# Patient Record
Sex: Male | Born: 1950 | Race: White | Hispanic: No | Marital: Married | Smoking: Never smoker
Health system: Southern US, Community
[De-identification: ages and names within clinical notes are randomized; demographics above are authoritative.]

## PROBLEM LIST (undated history)

## (undated) DIAGNOSIS — R0609 Other forms of dyspnea: Secondary | ICD-10-CM

## (undated) DIAGNOSIS — M810 Age-related osteoporosis without current pathological fracture: Secondary | ICD-10-CM

## (undated) DIAGNOSIS — L0390 Cellulitis, unspecified: Secondary | ICD-10-CM

## (undated) DIAGNOSIS — K4090 Unilateral inguinal hernia, without obstruction or gangrene, not specified as recurrent: Secondary | ICD-10-CM

## (undated) DIAGNOSIS — L089 Local infection of the skin and subcutaneous tissue, unspecified: Secondary | ICD-10-CM

## (undated) DIAGNOSIS — C439 Malignant melanoma of skin, unspecified: Secondary | ICD-10-CM

## (undated) DIAGNOSIS — E785 Hyperlipidemia, unspecified: Secondary | ICD-10-CM

## (undated) DIAGNOSIS — I10 Essential (primary) hypertension: Secondary | ICD-10-CM

## (undated) DIAGNOSIS — C801 Malignant (primary) neoplasm, unspecified: Secondary | ICD-10-CM

## (undated) DIAGNOSIS — R002 Palpitations: Secondary | ICD-10-CM

## (undated) HISTORY — DX: Cellulitis, unspecified: L03.90

## (undated) HISTORY — PX: OTHER SURGICAL HISTORY: SHX169

## (undated) HISTORY — DX: Palpitations: R00.2

## (undated) HISTORY — DX: Local infection of the skin and subcutaneous tissue, unspecified: L08.9

## (undated) HISTORY — DX: Age-related osteoporosis without current pathological fracture: M81.0

## (undated) HISTORY — DX: Hyperlipidemia, unspecified: E78.5

## (undated) HISTORY — PX: MELANOMA EXCISION: SHX5266

## (undated) HISTORY — DX: Other forms of dyspnea: R06.09

## (undated) HISTORY — PX: TONSILLECTOMY: SUR1361

## (undated) HISTORY — DX: Unilateral inguinal hernia, without obstruction or gangrene, not specified as recurrent: K40.90

## (undated) HISTORY — DX: Malignant melanoma of skin, unspecified: C43.9

---

## 2012-01-27 HISTORY — PX: INGUINAL HERNIA REPAIR: SHX194

## 2016-06-04 DIAGNOSIS — E559 Vitamin D deficiency, unspecified: Secondary | ICD-10-CM | POA: Diagnosis not present

## 2016-06-04 DIAGNOSIS — E782 Mixed hyperlipidemia: Secondary | ICD-10-CM | POA: Diagnosis not present

## 2016-06-10 DIAGNOSIS — R030 Elevated blood-pressure reading, without diagnosis of hypertension: Secondary | ICD-10-CM | POA: Diagnosis not present

## 2016-06-10 DIAGNOSIS — E782 Mixed hyperlipidemia: Secondary | ICD-10-CM | POA: Diagnosis not present

## 2016-06-10 DIAGNOSIS — E210 Primary hyperparathyroidism: Secondary | ICD-10-CM | POA: Diagnosis not present

## 2016-09-08 DIAGNOSIS — Z87442 Personal history of urinary calculi: Secondary | ICD-10-CM | POA: Diagnosis not present

## 2016-09-09 DIAGNOSIS — Z87442 Personal history of urinary calculi: Secondary | ICD-10-CM | POA: Diagnosis not present

## 2016-09-10 DIAGNOSIS — E210 Primary hyperparathyroidism: Secondary | ICD-10-CM | POA: Diagnosis not present

## 2016-09-10 DIAGNOSIS — Z87442 Personal history of urinary calculi: Secondary | ICD-10-CM | POA: Diagnosis not present

## 2016-12-30 DIAGNOSIS — Z23 Encounter for immunization: Secondary | ICD-10-CM | POA: Diagnosis not present

## 2016-12-30 DIAGNOSIS — E782 Mixed hyperlipidemia: Secondary | ICD-10-CM | POA: Diagnosis not present

## 2016-12-30 DIAGNOSIS — E210 Primary hyperparathyroidism: Secondary | ICD-10-CM | POA: Diagnosis not present

## 2016-12-30 DIAGNOSIS — Z0000 Encounter for general adult medical examination without abnormal findings: Secondary | ICD-10-CM | POA: Diagnosis not present

## 2016-12-30 DIAGNOSIS — Z125 Encounter for screening for malignant neoplasm of prostate: Secondary | ICD-10-CM | POA: Diagnosis not present

## 2017-01-06 DIAGNOSIS — R030 Elevated blood-pressure reading, without diagnosis of hypertension: Secondary | ICD-10-CM | POA: Diagnosis not present

## 2017-01-06 DIAGNOSIS — Z87442 Personal history of urinary calculi: Secondary | ICD-10-CM | POA: Diagnosis not present

## 2017-01-06 DIAGNOSIS — E782 Mixed hyperlipidemia: Secondary | ICD-10-CM | POA: Diagnosis not present

## 2017-01-06 DIAGNOSIS — E210 Primary hyperparathyroidism: Secondary | ICD-10-CM | POA: Diagnosis not present

## 2018-03-04 DIAGNOSIS — Z125 Encounter for screening for malignant neoplasm of prostate: Secondary | ICD-10-CM | POA: Diagnosis not present

## 2018-03-04 DIAGNOSIS — Z87442 Personal history of urinary calculi: Secondary | ICD-10-CM | POA: Diagnosis not present

## 2018-03-04 DIAGNOSIS — R030 Elevated blood-pressure reading, without diagnosis of hypertension: Secondary | ICD-10-CM | POA: Diagnosis not present

## 2018-03-04 DIAGNOSIS — E782 Mixed hyperlipidemia: Secondary | ICD-10-CM | POA: Diagnosis not present

## 2018-03-04 DIAGNOSIS — E210 Primary hyperparathyroidism: Secondary | ICD-10-CM | POA: Diagnosis not present

## 2018-03-07 DIAGNOSIS — M8580 Other specified disorders of bone density and structure, unspecified site: Secondary | ICD-10-CM | POA: Diagnosis not present

## 2018-03-07 DIAGNOSIS — M810 Age-related osteoporosis without current pathological fracture: Secondary | ICD-10-CM | POA: Diagnosis not present

## 2018-03-07 DIAGNOSIS — E782 Mixed hyperlipidemia: Secondary | ICD-10-CM | POA: Diagnosis not present

## 2018-03-07 DIAGNOSIS — Z0000 Encounter for general adult medical examination without abnormal findings: Secondary | ICD-10-CM | POA: Diagnosis not present

## 2018-03-07 DIAGNOSIS — M545 Low back pain: Secondary | ICD-10-CM | POA: Diagnosis not present

## 2018-03-09 DIAGNOSIS — M810 Age-related osteoporosis without current pathological fracture: Secondary | ICD-10-CM | POA: Diagnosis not present

## 2018-03-09 DIAGNOSIS — Z87442 Personal history of urinary calculi: Secondary | ICD-10-CM | POA: Diagnosis not present

## 2018-03-09 DIAGNOSIS — E210 Primary hyperparathyroidism: Secondary | ICD-10-CM | POA: Diagnosis not present

## 2018-03-09 DIAGNOSIS — Z6823 Body mass index (BMI) 23.0-23.9, adult: Secondary | ICD-10-CM | POA: Diagnosis not present

## 2018-03-14 DIAGNOSIS — Z1212 Encounter for screening for malignant neoplasm of rectum: Secondary | ICD-10-CM | POA: Diagnosis not present

## 2018-03-14 DIAGNOSIS — Z1211 Encounter for screening for malignant neoplasm of colon: Secondary | ICD-10-CM | POA: Diagnosis not present

## 2018-03-23 DIAGNOSIS — R195 Other fecal abnormalities: Secondary | ICD-10-CM | POA: Diagnosis not present

## 2018-04-06 DIAGNOSIS — E782 Mixed hyperlipidemia: Secondary | ICD-10-CM | POA: Diagnosis not present

## 2018-04-06 DIAGNOSIS — Z1211 Encounter for screening for malignant neoplasm of colon: Secondary | ICD-10-CM | POA: Diagnosis not present

## 2018-04-06 DIAGNOSIS — I10 Essential (primary) hypertension: Secondary | ICD-10-CM | POA: Diagnosis not present

## 2018-05-09 DIAGNOSIS — M810 Age-related osteoporosis without current pathological fracture: Secondary | ICD-10-CM | POA: Diagnosis not present

## 2018-05-09 DIAGNOSIS — E210 Primary hyperparathyroidism: Secondary | ICD-10-CM | POA: Diagnosis not present

## 2018-05-09 DIAGNOSIS — E782 Mixed hyperlipidemia: Secondary | ICD-10-CM | POA: Diagnosis not present

## 2018-05-12 DIAGNOSIS — E210 Primary hyperparathyroidism: Secondary | ICD-10-CM | POA: Diagnosis not present

## 2018-05-12 DIAGNOSIS — Z87442 Personal history of urinary calculi: Secondary | ICD-10-CM | POA: Diagnosis not present

## 2018-05-12 DIAGNOSIS — M810 Age-related osteoporosis without current pathological fracture: Secondary | ICD-10-CM | POA: Diagnosis not present

## 2018-05-17 DIAGNOSIS — E210 Primary hyperparathyroidism: Secondary | ICD-10-CM | POA: Diagnosis not present

## 2018-05-17 DIAGNOSIS — I10 Essential (primary) hypertension: Secondary | ICD-10-CM | POA: Diagnosis not present

## 2018-05-17 DIAGNOSIS — R030 Elevated blood-pressure reading, without diagnosis of hypertension: Secondary | ICD-10-CM | POA: Diagnosis not present

## 2018-05-17 DIAGNOSIS — E782 Mixed hyperlipidemia: Secondary | ICD-10-CM | POA: Diagnosis not present

## 2018-05-30 DIAGNOSIS — K635 Polyp of colon: Secondary | ICD-10-CM | POA: Diagnosis not present

## 2018-05-30 DIAGNOSIS — R195 Other fecal abnormalities: Secondary | ICD-10-CM | POA: Diagnosis not present

## 2018-05-30 DIAGNOSIS — D123 Benign neoplasm of transverse colon: Secondary | ICD-10-CM | POA: Diagnosis not present

## 2018-05-30 DIAGNOSIS — K5730 Diverticulosis of large intestine without perforation or abscess without bleeding: Secondary | ICD-10-CM | POA: Diagnosis not present

## 2018-07-28 DIAGNOSIS — E559 Vitamin D deficiency, unspecified: Secondary | ICD-10-CM | POA: Diagnosis not present

## 2018-07-28 DIAGNOSIS — I10 Essential (primary) hypertension: Secondary | ICD-10-CM | POA: Diagnosis not present

## 2018-08-05 DIAGNOSIS — M810 Age-related osteoporosis without current pathological fracture: Secondary | ICD-10-CM | POA: Diagnosis not present

## 2018-08-05 DIAGNOSIS — I10 Essential (primary) hypertension: Secondary | ICD-10-CM | POA: Diagnosis not present

## 2018-08-05 DIAGNOSIS — E782 Mixed hyperlipidemia: Secondary | ICD-10-CM | POA: Diagnosis not present

## 2018-10-17 ENCOUNTER — Other Ambulatory Visit: Payer: Self-pay | Admitting: Surgery

## 2018-10-17 DIAGNOSIS — E210 Primary hyperparathyroidism: Secondary | ICD-10-CM

## 2018-10-25 ENCOUNTER — Other Ambulatory Visit: Payer: Self-pay

## 2018-10-25 ENCOUNTER — Encounter (HOSPITAL_COMMUNITY)
Admission: RE | Admit: 2018-10-25 | Discharge: 2018-10-25 | Disposition: A | Discharge status: Home / Self Care | Payer: Medicare PPO | Source: Ambulatory Visit | Attending: Surgery | Admitting: Surgery

## 2018-10-25 ENCOUNTER — Ambulatory Visit (HOSPITAL_COMMUNITY)
Admission: RE | Admit: 2018-10-25 | Discharge: 2018-10-25 | Disposition: A | Discharge status: Home / Self Care | Payer: Medicare PPO | Source: Ambulatory Visit | Attending: Surgery | Admitting: Surgery

## 2018-10-25 DIAGNOSIS — E079 Disorder of thyroid, unspecified: Secondary | ICD-10-CM | POA: Diagnosis not present

## 2018-10-25 DIAGNOSIS — E041 Nontoxic single thyroid nodule: Secondary | ICD-10-CM | POA: Diagnosis not present

## 2018-10-25 DIAGNOSIS — E210 Primary hyperparathyroidism: Secondary | ICD-10-CM | POA: Diagnosis not present

## 2018-10-25 MED ORDER — TECHNETIUM TC 99M SESTAMIBI - CARDIOLITE
27.5000 | Freq: Once | INTRAVENOUS | Status: AC | PRN
  Administered 2018-10-25: 27.5 via INTRAVENOUS

## 2018-11-09 ENCOUNTER — Ambulatory Visit: Payer: Self-pay | Admitting: Surgery

## 2018-11-09 DIAGNOSIS — E210 Primary hyperparathyroidism: Secondary | ICD-10-CM | POA: Diagnosis not present

## 2018-11-09 DIAGNOSIS — E041 Nontoxic single thyroid nodule: Secondary | ICD-10-CM | POA: Diagnosis not present

## 2018-11-09 DIAGNOSIS — M810 Age-related osteoporosis without current pathological fracture: Secondary | ICD-10-CM | POA: Diagnosis not present

## 2018-11-14 DIAGNOSIS — H10502 Unspecified blepharoconjunctivitis, left eye: Secondary | ICD-10-CM | POA: Diagnosis not present

## 2018-12-25 ENCOUNTER — Encounter (HOSPITAL_COMMUNITY): Payer: Self-pay | Admitting: Surgery

## 2018-12-25 DIAGNOSIS — E210 Primary hyperparathyroidism: Secondary | ICD-10-CM | POA: Diagnosis present

## 2018-12-25 NOTE — H&P (Signed)
General Surgery [facility]  Markus Daft DOB: 31-Mar-1950 Married / Language: English / Race: White Male   History of Present Illness  The patient is a 68 year old male who presents with primary hyperparathyroidism.  CHIEF COMPLAINT: primary hyperparathyroidism  Patient is self-referred. His primary care physician is Dr. Merrilee Seashore. Patient initially developed nephrolithiasis. CT scan demonstrated multiple stones. Additional studies included a bone density scan which demonstrated osteoporosis. Laboratory studies beginning in February 2020 showed an elevated serum calcium level of 10.6. Intact PTH levels have ranged from 56-73. Vitamin D level was slightly low at 25. 24-hour urine collection for calcium was elevated at 358. Patient underwent nuclear medicine parathyroid scan on October 25, 2018. This demonstrated an approximately 2 cm lesion inferior to the right lobe of the thyroid gland suspicious for parathyroid adenoma. Ultrasound examination of the neck on the same date showed a normal-sized thyroid gland. There was a 2.8 x 2.7 x 1.9 cm lobulated solid mass inferior to the right lobe of the thyroid insistent with parathyroid adenoma. Patient also had 2 small nodules in the right thyroid lobe and follow-up ultrasound was recommended in 1 year. Patient has had no prior head or neck surgery. There is no family history of parathyroid disease or other endocrine neoplasm. 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   Allergies Crestor *ANTIHYPERLIPIDEMICS*  muscle aches  Medication History amLODIPine Besylate (2.5MG  Tablet, Oral) Active. Pravastatin Sodium (20MG  Tablet, Oral) Active. Medications Reconciled  Vitals Weight: 147.2 lb Height: 67in Body Surface Area: 1.78 m Body Mass Index: 23.05 kg/m  Temp.: 97.32F(Temporal)  Pulse: 81 (Regular)  BP: 122/74 (Sitting, Left Arm, Standard)  Physical  Exam  See vital signs recorded above  GENERAL APPEARANCE Development: normal Nutritional status: normal Gross deformities: none  SKIN Rash, lesions, ulcers: none Induration, erythema: none Nodules: none palpable  EYES Conjunctiva and lids: normal Pupils: equal and reactive Iris: normal bilaterally  EARS, NOSE, MOUTH, THROAT External ears: no lesion or deformity External nose: no lesion or deformity Hearing: grossly normal Patient is wearing a mask.  NECK Symmetric: yes Trachea: midline Thyroid: no palpable nodules in the thyroid bed  CHEST Respiratory effort: normal Retraction or accessory muscle use: no Breath sounds: normal bilaterally Rales, rhonchi, wheeze: none  CARDIOVASCULAR Auscultation: regular rhythm, normal rate Murmurs: none Pulses: carotid and radial pulse 2+ palpable Lower extremity edema: none Lower extremity varicosities: none  MUSCULOSKELETAL Station and gait: normal Digits and nails: no clubbing or cyanosis Muscle strength: grossly normal all extremities Range of motion: grossly normal all extremities Deformity: none  LYMPHATIC Cervical: none palpable Supraclavicular: none palpable  PSYCHIATRIC Oriented to person, place, and time: yes Mood and affect: normal for situation Judgment and insight: appropriate for situation    Assessment & Plan   PRIMARY HYPERPARATHYROIDISM (E21.0) THYROID NODULE (E04.1) OSTEOPOROSIS (M81.0)  Pt Education - Pamphlet Given - The Parathyroid Surgery Book: discussed with patient and provided information.  Patient presents today to discuss results of imaging studies and to make plans for surgical intervention for treatment of primary hyperparathyroidism. Patient is provided with written literature on parathyroid surgery to review at home.  Patient has biochemical evidence of primary hyperparathyroidism. He has developed complications including nephrolithiasis and osteoporosis. 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 Imaging studies also demonstrate small right-sided thyroid nodules which will be followed by ultrasound examination.  We discussed minimally invasive parathyroidectomy. We discussed the risk and benefits  of the procedure. We discussed traditional neck exploration with parathyroidectomy as an alternative surgical technique. We discussed the location of the surgical incision, the hospital stay to be anticipated, and the postoperative recovery and return to work and activities. The patient understands and wishes to proceed with surgery in the near future.  The risks and benefits of the procedure have been discussed at length with the patient. The patient understands the proposed procedure, potential alternative treatments, and the course of recovery to be expected. All of the patient's questions have been answered at this time. The patient wishes to proceed with surgery.  Armandina Gemma, [facility] Office: [phone]

## 2018-12-26 NOTE — Patient Instructions (Addendum)
DUE TO COVID-19 ONLY ONE VISITOR IS ALLOWED TO COME WITH YOU AND STAY IN THE WAITING ROOM ONLY DURING PRE OP AND PROCEDURE DAY OF SURGERY. THE 1 VISITOR MAY VISIT WITH YOU AFTER SURGERY IN YOUR PRIVATE ROOM DURING VISITING HOURS ONLY!  YOU NEED TO HAVE A COVID 19 TEST ON: 12/27/2018.  PLEASE CONTINUE THE QUARANTINE INSTRUCTIONS AS OUTLINED IN YOUR HANDOUT.                   Your procedure is scheduled on:12/30/2018    Report to [facility]  Entrance    Report to admitting at: 9:30 AM     Call this number if you have problems the morning of surgery [phone]    Remember: Do not eat food or drink liquids :After Midnight.      Take these medicines the morning of surgery with A SIP OF WATER: Amlodipine(Norvasc), and Pravastatin   BRUSH YOUR TEETH MORNING OF SURGERY AND RINSE YOUR MOUTH OUT, NO CHEWING GUM CANDY OR MINTS.                          You may not have any metal on your body including hair pins and              piercings    Do not wear jewelry, cologne, lotions, powders or deodorant                 Men may shave face and neck.   Do not bring valuables to the hospital. [place].  Contacts, dentures or bridgework may not be worn into surgery.      Patients discharged the day of surgery will not be allowed to drive home. IF YOU ARE HAVING SURGERY AND GOING HOME THE SAME DAY, YOU MUST HAVE AN ADULT TO DRIVE YOU HOME AND BE WITH YOU FOR 24 HOURS. YOU MAY GO HOME BY TAXI OR UBER OR ORTHERWISE, BUT AN ADULT MUST ACCOMPANY YOU HOME AND STAY WITH YOU FOR 24 HOURS.  Name and phone number of your driver: Craig Christian [phone]  Special Instructions: N/A             _____________________________________________________________________   [facility] - Preparing for Surgery Before surgery, you can play an important role.  Because skin is not sterile, your skin needs to be as free of germs as possible.  You  can reduce the number of germs on your skin by washing with CHG (chlorahexidine gluconate) soap before surgery.  CHG is an antiseptic cleaner which kills germs and bonds with the skin to continue killing germs even after washing. Please DO NOT use if you have an allergy to CHG or antibacterial soaps.  If your skin becomes reddened/irritated stop using the CHG and inform your nurse when you arrive at Short Stay. Do not shave (including legs and underarms) for at least 48 hours prior to the first CHG shower.  You may shave your face/neck. Please follow these instructions carefully:  1.  Shower with CHG Soap the night before surgery and the  morning of Surgery.  2.  If you choose to wash your hair, wash your hair first as usual with your  normal  shampoo.  3.  After you shampoo, rinse your hair and body thoroughly to remove the  shampoo.  4.  Use CHG as you would any other liquid soap.  You can apply chg directly  to the skin and wash                       Gently with a scrungie or clean washcloth.  5.  Apply the CHG Soap to your body ONLY FROM THE NECK DOWN.   Do not use on face/ open                           Wound or open sores. Avoid contact with eyes, ears mouth and genitals (private parts).                       Wash face,  Genitals (private parts) with your normal soap.             6.  Wash thoroughly, paying special attention to the area where your surgery  will be performed.  7.  Thoroughly rinse your body with warm water from the neck down.  8.  DO NOT shower/wash with your normal soap after using and rinsing off  the CHG Soap.                9.  Pat yourself dry with a clean towel.            10.  Wear clean pajamas.            11.  Place clean sheets on your bed the night of your first shower and do not  sleep with pets. Day of Surgery : Do not apply any lotions/deodorants the morning of surgery.  Please wear clean clothes to the hospital/surgery center.  FAILURE  TO FOLLOW THESE INSTRUCTIONS MAY RESULT IN THE CANCELLATION OF YOUR SURGERY PATIENT SIGNATURE_________________________________  NURSE SIGNATURE__________________________________  ________________________________________________________________________

## 2018-12-27 ENCOUNTER — Other Ambulatory Visit (HOSPITAL_COMMUNITY)
Admission: RE | Admit: 2018-12-27 | Discharge: 2018-12-27 | Disposition: A | Discharge status: Home / Self Care | Payer: Medicare PPO | Source: Ambulatory Visit | Attending: Surgery | Admitting: Surgery

## 2018-12-27 ENCOUNTER — Other Ambulatory Visit (HOSPITAL_COMMUNITY): Payer: Medicare PPO

## 2018-12-27 DIAGNOSIS — Z01812 Encounter for preprocedural laboratory examination: Secondary | ICD-10-CM | POA: Insufficient documentation

## 2018-12-27 DIAGNOSIS — Z20828 Contact with and (suspected) exposure to other viral communicable diseases: Secondary | ICD-10-CM | POA: Insufficient documentation

## 2018-12-28 ENCOUNTER — Other Ambulatory Visit: Payer: Self-pay

## 2018-12-28 ENCOUNTER — Encounter (HOSPITAL_COMMUNITY)
Admission: RE | Admit: 2018-12-28 | Discharge: 2018-12-28 | Disposition: A | Discharge status: Home / Self Care | Payer: Medicare PPO | Source: Ambulatory Visit | Attending: Surgery | Admitting: Surgery

## 2018-12-28 ENCOUNTER — Encounter (HOSPITAL_COMMUNITY): Payer: Self-pay

## 2018-12-28 DIAGNOSIS — R001 Bradycardia, unspecified: Secondary | ICD-10-CM | POA: Diagnosis not present

## 2018-12-28 DIAGNOSIS — I440 Atrioventricular block, first degree: Secondary | ICD-10-CM | POA: Insufficient documentation

## 2018-12-28 DIAGNOSIS — I10 Essential (primary) hypertension: Secondary | ICD-10-CM | POA: Diagnosis not present

## 2018-12-28 DIAGNOSIS — Z01818 Encounter for other preprocedural examination: Secondary | ICD-10-CM | POA: Insufficient documentation

## 2018-12-28 DIAGNOSIS — E210 Primary hyperparathyroidism: Secondary | ICD-10-CM | POA: Diagnosis not present

## 2018-12-28 HISTORY — DX: Malignant (primary) neoplasm, unspecified: C80.1

## 2018-12-28 HISTORY — DX: Essential (primary) hypertension: I10

## 2018-12-28 LAB — BASIC METABOLIC PANEL
Anion gap: 6 (ref 5–15)
BUN: 19 mg/dL (ref 8–23)
CO2: 27 mmol/L (ref 22–32)
Calcium: 10.6 mg/dL — ABNORMAL HIGH (ref 8.9–10.3)
Chloride: 106 mmol/L (ref 98–111)
Creatinine, Ser: 0.9 mg/dL (ref 0.61–1.24)
GFR calc Af Amer: >60 mL/min (ref >60)
GFR calc non Af Amer: >60 mL/min (ref >60)
Glucose, Bld: 102 mg/dL — ABNORMAL HIGH (ref 70–99)
Potassium: 5.2 mmol/L — ABNORMAL HIGH (ref 3.5–5.1)
Sodium: 139 mmol/L (ref 135–145)

## 2018-12-28 LAB — CBC
HCT: 47.2 % (ref 39.0–52.0)
Hemoglobin: 15.2 g/dL (ref 13.0–17.0)
MCH: 29.7 pg (ref 26.0–34.0)
MCHC: 32.2 g/dL (ref 30.0–36.0)
MCV: 92.2 fL (ref 80.0–100.0)
Platelets: 244 10*3/uL (ref 150–400)
RBC: 5.12 MIL/uL (ref 4.22–5.81)
RDW: 12.8 % (ref 11.5–15.5)
WBC: 7 10*3/uL (ref 4.0–10.5)
nRBC: 0 % (ref 0.0–0.2)

## 2018-12-28 NOTE — Progress Notes (Signed)
PCP - Merrilee Seashore  Cardiologist -   Chest x-ray -   EKG - 12-28-18   Stress Test -  ECHO -  Cardiac Cath -   Sleep Study -  CPAP -   Fasting Blood Sugar -  Checks Blood Sugar _____ times a day  Blood Thinner Instructions: Aspirin Instructions: Last Dose:  Anesthesia review:   Patient denies shortness of breath, fever, cough and chest pain at PAT appointment   Patient verbalized understanding of instructions that were given to them at the PAT appointment. Patient was also instructed that they will need to review over the PAT instructions again at home before surgery.

## 2018-12-29 LAB — NOVEL CORONAVIRUS, NAA (HOSP ORDER, SEND-OUT TO REF LAB; TAT 18-24 HRS): SARS-CoV-2, NAA: NOT DETECTED

## 2018-12-30 ENCOUNTER — Other Ambulatory Visit: Payer: Self-pay

## 2018-12-30 ENCOUNTER — Ambulatory Visit (HOSPITAL_COMMUNITY): Payer: Medicare PPO | Admitting: Physician Assistant

## 2018-12-30 ENCOUNTER — Encounter (HOSPITAL_COMMUNITY)
Admission: RE | Disposition: A | Discharge status: Home / Self Care | Payer: Self-pay | Source: Home / Self Care | Attending: Surgery

## 2018-12-30 ENCOUNTER — Encounter (HOSPITAL_COMMUNITY): Payer: Self-pay | Admitting: Emergency Medicine

## 2018-12-30 ENCOUNTER — Ambulatory Visit (HOSPITAL_COMMUNITY): Payer: Medicare PPO | Admitting: Certified Registered"

## 2018-12-30 ENCOUNTER — Ambulatory Visit (HOSPITAL_COMMUNITY)
Admission: RE | Admit: 2018-12-30 | Discharge: 2018-12-30 | Disposition: A | Discharge status: Home / Self Care | Payer: Medicare PPO | Attending: Surgery | Admitting: Surgery

## 2018-12-30 DIAGNOSIS — E210 Primary hyperparathyroidism: Secondary | ICD-10-CM | POA: Diagnosis not present

## 2018-12-30 DIAGNOSIS — D351 Benign neoplasm of parathyroid gland: Secondary | ICD-10-CM | POA: Insufficient documentation

## 2018-12-30 DIAGNOSIS — Z79899 Other long term (current) drug therapy: Secondary | ICD-10-CM | POA: Diagnosis not present

## 2018-12-30 DIAGNOSIS — I10 Essential (primary) hypertension: Secondary | ICD-10-CM | POA: Insufficient documentation

## 2018-12-30 HISTORY — PX: PARATHYROIDECTOMY: SHX19

## 2018-12-30 SURGERY — PARATHYROIDECTOMY
Anesthesia: General | Site: Neck | Laterality: Right

## 2018-12-30 MED ORDER — BUPIVACAINE HCL (PF) 0.5 % IJ SOLN
INTRAMUSCULAR | Status: AC
  Filled 2018-12-30: qty 30

## 2018-12-30 MED ORDER — DEXAMETHASONE SODIUM PHOSPHATE 10 MG/ML IJ SOLN
INTRAMUSCULAR | Status: AC
  Filled 2018-12-30: qty 1

## 2018-12-30 MED ORDER — TRAMADOL HCL 50 MG PO TABS: 50.0000 mg | ORAL_TABLET | Freq: Four times a day (QID) | ORAL | 0 refills | Status: AC | PRN

## 2018-12-30 MED ORDER — CHLORHEXIDINE GLUCONATE CLOTH 2 % EX PADS: 6.0000 | MEDICATED_PAD | Freq: Once | CUTANEOUS | Status: DC

## 2018-12-30 MED ORDER — LIDOCAINE 2% (20 MG/ML) 5 ML SYRINGE
INTRAMUSCULAR | Status: DC | PRN
  Administered 2018-12-30: 40 mg via INTRAVENOUS

## 2018-12-30 MED ORDER — FENTANYL CITRATE (PF) 100 MCG/2ML IJ SOLN
INTRAMUSCULAR | Status: AC
  Filled 2018-12-30: qty 2

## 2018-12-30 MED ORDER — PROPOFOL 10 MG/ML IV BOLUS
INTRAVENOUS | Status: DC | PRN
  Administered 2018-12-30: 100 mg via INTRAVENOUS
  Administered 2018-12-30: 20 mg via INTRAVENOUS
  Administered 2018-12-30: 50 mg via INTRAVENOUS

## 2018-12-30 MED ORDER — ONDANSETRON HCL 4 MG/2ML IJ SOLN
INTRAMUSCULAR | Status: DC | PRN
  Administered 2018-12-30: 4 mg via INTRAVENOUS

## 2018-12-30 MED ORDER — PROPOFOL 10 MG/ML IV BOLUS
INTRAVENOUS | Status: AC
  Filled 2018-12-30: qty 20

## 2018-12-30 MED ORDER — EPHEDRINE SULFATE-NACL 50-0.9 MG/10ML-% IV SOSY
PREFILLED_SYRINGE | INTRAVENOUS | Status: DC | PRN
  Administered 2018-12-30: 2.5 mg via INTRAVENOUS

## 2018-12-30 MED ORDER — ONDANSETRON HCL 4 MG/2ML IJ SOLN
INTRAMUSCULAR | Status: AC
  Filled 2018-12-30: qty 2

## 2018-12-30 MED ORDER — LACTATED RINGERS IV SOLN
INTRAVENOUS | Status: DC
  Administered 2018-12-30: 10:00:00 via INTRAVENOUS

## 2018-12-30 MED ORDER — ACETAMINOPHEN 10 MG/ML IV SOLN
INTRAVENOUS | Status: DC | PRN
  Administered 2018-12-30: 1000 mg via INTRAVENOUS

## 2018-12-30 MED ORDER — CEFAZOLIN SODIUM-DEXTROSE 2-4 GM/100ML-% IV SOLN
2.0000 g | INTRAVENOUS | Status: AC
  Administered 2018-12-30: 2 g via INTRAVENOUS
  Filled 2018-12-30: qty 100

## 2018-12-30 MED ORDER — MIDAZOLAM HCL 2 MG/2ML IJ SOLN
INTRAMUSCULAR | Status: AC
  Filled 2018-12-30: qty 2

## 2018-12-30 MED ORDER — ONDANSETRON HCL 4 MG/2ML IJ SOLN: 4.0000 mg | Freq: Once | INTRAMUSCULAR | Status: DC | PRN

## 2018-12-30 MED ORDER — ACETAMINOPHEN 10 MG/ML IV SOLN
INTRAVENOUS | Status: AC
  Filled 2018-12-30: qty 100

## 2018-12-30 MED ORDER — BUPIVACAINE HCL 0.5 % IJ SOLN
INTRAMUSCULAR | Status: DC | PRN
  Administered 2018-12-30: 9 mL

## 2018-12-30 MED ORDER — FENTANYL CITRATE (PF) 250 MCG/5ML IJ SOLN
INTRAMUSCULAR | Status: DC | PRN
  Administered 2018-12-30: 100 ug via INTRAVENOUS
  Administered 2018-12-30 (×2): 50 ug via INTRAVENOUS

## 2018-12-30 MED ORDER — LIDOCAINE 2% (20 MG/ML) 5 ML SYRINGE
INTRAMUSCULAR | Status: AC
  Filled 2018-12-30: qty 5

## 2018-12-30 MED ORDER — DEXAMETHASONE SODIUM PHOSPHATE 10 MG/ML IJ SOLN
INTRAMUSCULAR | Status: DC | PRN
  Administered 2018-12-30: 4 mg via INTRAVENOUS

## 2018-12-30 MED ORDER — ROCURONIUM BROMIDE 10 MG/ML (PF) SYRINGE
PREFILLED_SYRINGE | INTRAVENOUS | Status: AC
  Filled 2018-12-30: qty 10

## 2018-12-30 MED ORDER — 0.9 % SODIUM CHLORIDE (POUR BTL) OPTIME
TOPICAL | Status: DC | PRN
  Administered 2018-12-30: 1000 mL

## 2018-12-30 MED ORDER — ROCURONIUM BROMIDE 10 MG/ML (PF) SYRINGE
PREFILLED_SYRINGE | INTRAVENOUS | Status: DC | PRN
  Administered 2018-12-30: 50 mg via INTRAVENOUS

## 2018-12-30 MED ORDER — EPHEDRINE 5 MG/ML INJ
INTRAVENOUS | Status: AC
  Filled 2018-12-30: qty 10

## 2018-12-30 MED ORDER — PHENYLEPHRINE 40 MCG/ML (10ML) SYRINGE FOR IV PUSH (FOR BLOOD PRESSURE SUPPORT)
PREFILLED_SYRINGE | INTRAVENOUS | Status: AC
  Filled 2018-12-30: qty 10

## 2018-12-30 MED ORDER — SUGAMMADEX SODIUM 200 MG/2ML IV SOLN
INTRAVENOUS | Status: DC | PRN
  Administered 2018-12-30: 140 mg via INTRAVENOUS

## 2018-12-30 MED ORDER — FENTANYL CITRATE (PF) 100 MCG/2ML IJ SOLN: 25.0000 ug | INTRAMUSCULAR | Status: DC | PRN

## 2018-12-30 SURGICAL SUPPLY — 30 items
ATTRACTOMAT 16X20 MAGNETIC DRP (DRAPES) ×3 IMPLANT
BLADE SURG 15 STRL LF DISP TIS (BLADE) ×1 IMPLANT
BLADE SURG 15 STRL SS (BLADE) ×2
CHLORAPREP W/TINT 26 (MISCELLANEOUS) ×3 IMPLANT
CLIP VESOCCLUDE MED 6/CT (CLIP) ×6 IMPLANT
CLIP VESOCCLUDE SM WIDE 6/CT (CLIP) ×6 IMPLANT
COVER SURGICAL LIGHT HANDLE (MISCELLANEOUS) ×3 IMPLANT
COVER WAND RF STERILE (DRAPES) ×3 IMPLANT
DERMABOND ADVANCED (GAUZE/BANDAGES/DRESSINGS) ×2
DERMABOND ADVANCED .7 DNX12 (GAUZE/BANDAGES/DRESSINGS) ×1 IMPLANT
DRAPE LAPAROTOMY T 98X78 PEDS (DRAPES) ×3 IMPLANT
ELECT REM PT RETURN 15FT ADLT (MISCELLANEOUS) ×3 IMPLANT
GAUZE 4X4 16PLY RFD (DISPOSABLE) ×3 IMPLANT
GLOVE SURG ORTHO 8.0 STRL STRW (GLOVE) ×3 IMPLANT
GOWN STRL REUS W/TWL XL LVL3 (GOWN DISPOSABLE) ×9 IMPLANT
HEMOSTAT SURGICEL 2X4 FIBR (HEMOSTASIS) IMPLANT
ILLUMINATOR WAVEGUIDE N/F (MISCELLANEOUS) IMPLANT
KIT BASIN OR (CUSTOM PROCEDURE TRAY) ×3 IMPLANT
KIT TURNOVER KIT A (KITS) IMPLANT
NEEDLE HYPO 25X1 1.5 SAFETY (NEEDLE) ×3 IMPLANT
PACK BASIC VI WITH GOWN DISP (CUSTOM PROCEDURE TRAY) ×3 IMPLANT
PENCIL SMOKE EVACUATOR (MISCELLANEOUS) ×3 IMPLANT
SUT MNCRL AB 4-0 PS2 18 (SUTURE) ×3 IMPLANT
SUT VIC AB 3-0 SH 18 (SUTURE) ×3 IMPLANT
SYR BULB IRRIGATION 50ML (SYRINGE) ×3 IMPLANT
SYR CONTROL 10ML LL (SYRINGE) ×3 IMPLANT
TOWEL OR 17X26 10 PK STRL BLUE (TOWEL DISPOSABLE) ×3 IMPLANT
TOWEL OR NON WOVEN STRL DISP B (DISPOSABLE) ×3 IMPLANT
TUBING CONNECTING 10 (TUBING) ×2 IMPLANT
TUBING CONNECTING 10' (TUBING) ×1

## 2018-12-30 NOTE — Interval H&P Note (Signed)
History and Physical Interval Note:  12/30/2018 10:45 AM  Craig Christian  has presented today for surgery, with the diagnosis of PRIMARY HYPERTHYROIDISM.  The various methods of treatment have been discussed with the patient and family. After consideration of risks, benefits and other options for treatment, the patient has consented to    Procedure(s): RIGHT INFERIOR PARATHYROIDECTOMY (Right) as a surgical intervention.    The patient's history has been reviewed, patient examined, no change in status, stable for surgery.  I have reviewed the patient's chart and labs.  Questions were answered to the patient's satisfaction.    Armandina Gemma, MD [facility] Office: [place]

## 2018-12-30 NOTE — Anesthesia Procedure Notes (Signed)
Procedure Name: Intubation Date/Time: 12/30/2018 11:27 AM Performed by: Eben Burow, CRNA Pre-anesthesia Checklist: Patient identified, Emergency Drugs available, Suction available, Patient being monitored and Timeout performed Patient Re-evaluated:Patient Re-evaluated prior to induction Oxygen Delivery Method: Circle system utilized Preoxygenation: Pre-oxygenation with 100% oxygen Induction Type: IV induction Ventilation: Mask ventilation without difficulty Laryngoscope Size: Mac and 4 Grade View: Grade I Tube type: Oral Tube size: 7.5 mm Number of attempts: 1 Airway Equipment and Method: Stylet Placement Confirmation: ETT inserted through vocal cords under direct vision,  positive ETCO2 and breath sounds checked- equal and bilateral Secured at: 23 cm Tube secured with: Tape Dental Injury: Teeth and Oropharynx as per pre-operative assessment

## 2018-12-30 NOTE — Anesthesia Preprocedure Evaluation (Addendum)
Anesthesia Evaluation  Patient identified by MRN, date of birth, ID band Patient awake    Reviewed: Allergy & Precautions, NPO status , Patient's Chart, lab work & pertinent test results  Airway Mallampati: II  TM Distance: >3 FB Neck ROM: Full    Dental  (+) Dental Advisory Given, Implants   Pulmonary neg pulmonary ROS,    Pulmonary exam normal breath sounds clear to auscultation       Cardiovascular hypertension, Pt. on medications Normal cardiovascular exam Rhythm:Regular Rate:Normal     Neuro/Psych negative neurological ROS     GI/Hepatic negative GI ROS, Neg liver ROS,   Endo/Other  negative endocrine ROS  Renal/GU negative Renal ROS     Musculoskeletal negative musculoskeletal ROS (+)   Abdominal   Peds  Hematology negative hematology ROS (+)   Anesthesia Other Findings Day of surgery medications reviewed with the patient.  Reproductive/Obstetrics                            Anesthesia Physical Anesthesia Plan  ASA: II  Anesthesia Plan: General   Post-op Pain Management:    Induction: Intravenous  PONV Risk Score and Plan: 2 and Dexamethasone and Ondansetron  Airway Management Planned: Oral ETT  Additional Equipment:   Intra-op Plan:   Post-operative Plan: Extubation in OR  Informed Consent: I have reviewed the patients History and Physical, chart, labs and discussed the procedure including the risks, benefits and alternatives for the proposed anesthesia with the patient or authorized representative who has indicated his/her understanding and acceptance.     Dental advisory given  Plan Discussed with: CRNA  Anesthesia Plan Comments:         Anesthesia Quick Evaluation

## 2018-12-30 NOTE — Anesthesia Postprocedure Evaluation (Signed)
Anesthesia Post Note  Patient: Craig Christian  Procedure(s) Performed: RIGHT INFERIOR PARATHYROIDECTOMY (Right Neck)     Patient location during evaluation: PACU Anesthesia Type: General Level of consciousness: awake and alert Pain management: pain level controlled Vital Signs Assessment: post-procedure vital signs reviewed and stable Respiratory status: spontaneous breathing, nonlabored ventilation and respiratory function stable Cardiovascular status: blood pressure returned to baseline and stable Postop Assessment: no apparent nausea or vomiting Anesthetic complications: no    Last Vitals:  Vitals:   12/30/18 1315 12/30/18 1330  BP: (!) 157/79 (!) 179/91  Pulse: (!) 58 61  Resp:  16  Temp:    SpO2: 100% 99%    Last Pain:  Vitals:   12/30/18 1330  TempSrc:   PainSc: 0-No pain                 Catalina Gravel

## 2018-12-30 NOTE — Transfer of Care (Signed)
Immediate Anesthesia Transfer of Care Note  Patient: Craig Christian  Procedure(s) Performed: RIGHT INFERIOR PARATHYROIDECTOMY (Right Neck)  Patient Location: PACU  Anesthesia Type:General  Level of Consciousness: awake, alert  and oriented  Airway & Oxygen Therapy: Patient Spontanous Breathing and Patient connected to face mask oxygen  Post-op Assessment: Report given to RN and Post -op Vital signs reviewed and stable  Post vital signs: Reviewed and stable  Last Vitals:  Vitals Value Taken Time  BP 164/102 12/30/18 1239  Temp    Pulse 62 12/30/18 1241  Resp 13 12/30/18 1240  SpO2 100 % 12/30/18 1241  Vitals shown include unvalidated device data.  Last Pain:  Vitals:   12/30/18 0950  TempSrc:   PainSc: 0-No pain      Patients Stated Pain Goal: 4 ([id number])  Complications: No apparent anesthesia complications

## 2018-12-30 NOTE — Op Note (Addendum)
OPERATIVE REPORT - PARATHYROIDECTOMY  Preoperative diagnosis: Primary hyperparathyroidism  Postop diagnosis: Same  Procedure: Right inferior minimally invasive parathyroidectomy  Surgeon:  Armandina Gemma, MD  Anesthesia: General endotracheal  Estimated blood loss: Minimal  Preparation: ChloraPrep  Indications: Patient is self-referred. His primary care physician is Dr. Merrilee Seashore. Patient initially developed nephrolithiasis. CT scan demonstrated multiple stones. Additional studies included a bone density scan which demonstrated osteoporosis. Laboratory studies beginning in February 2020 showed an elevated serum calcium level of 10.6. Intact PTH levels have ranged from 56-73. Vitamin D level was slightly low at 25. 24-hour urine collection for calcium was elevated at 358. Patient underwent nuclear medicine parathyroid scan on October 25, 2018. This demonstrated an approximately 2 cm lesion inferior to the right lobe of the thyroid gland suspicious for parathyroid adenoma. Ultrasound examination of the neck on the same date showed a normal-sized thyroid gland. There was a 2.8 x 2.7 x 1.9 cm lobulated solid mass inferior to the right lobe of the thyroid consistent with parathyroid adenoma. Patient now comes to surgery for parathyroidectomy.  Procedure: The patient was prepared in the pre-operative holding area. The patient was brought to the operating room and placed in a supine position on the operating room table. Following administration of general anesthesia, the patient was positioned and then prepped and draped in the usual strict aseptic fashion. After ascertaining that an adequate level of anesthesia been achieved, a neck incision was made with a #15 blade. Dissection was carried through subcutaneous tissues and platysma. Hemostasis was obtained with the electrocautery. Skin flaps were developed circumferentially and a Weitlander retractor was placed for exposure.  Strap  muscles were incised in the midline. Strap muscles were reflected lateralley exposing the thyroid lobe. With gentle blunt dissection the thyroid lobe was mobilized.  Dissection was carried through adipose tissue and an enlarged parathyroid gland was identified. It was gently mobilized. Vascular structures were divided between small ligaclips. Care was taken to avoid the recurrent laryngeal nerve and the esophagus. The parathyroid gland was completely excised. It was submitted to pathology where Dr. Vicente Males performed a  frozen section biopsy and confirmed parathyroid tissue consistent with an adenoma weighing 4.38 gm.  Neck was irrigated with warm saline and good hemostasis was noted. Fibrillar was placed in the operative field. Strap muscles were approximated in the midline with interrupted 3-0 Vicryl sutures. Platysma was closed with interrupted 3-0 Vicryl sutures. Marcaine was infiltrated circumferentially. Skin was closed with a running 4-0 Monocryl subcuticular suture. Wound was washed and dried and Dermabond was applied. Patient was awakened from anesthesia and brought to the recovery room. The patient tolerated the procedure well.   Armandina Gemma, MD [facility] Office: [phone]

## 2018-12-31 ENCOUNTER — Encounter (HOSPITAL_COMMUNITY): Payer: Self-pay | Admitting: Surgery

## 2019-01-02 LAB — SURGICAL PATHOLOGY

## 2019-01-18 DIAGNOSIS — E210 Primary hyperparathyroidism: Secondary | ICD-10-CM | POA: Diagnosis not present

## 2019-03-17 DIAGNOSIS — E782 Mixed hyperlipidemia: Secondary | ICD-10-CM | POA: Diagnosis not present

## 2019-03-17 DIAGNOSIS — E210 Primary hyperparathyroidism: Secondary | ICD-10-CM | POA: Diagnosis not present

## 2019-03-17 DIAGNOSIS — E559 Vitamin D deficiency, unspecified: Secondary | ICD-10-CM | POA: Diagnosis not present

## 2019-03-17 DIAGNOSIS — I10 Essential (primary) hypertension: Secondary | ICD-10-CM | POA: Diagnosis not present

## 2019-03-24 DIAGNOSIS — E782 Mixed hyperlipidemia: Secondary | ICD-10-CM | POA: Diagnosis not present

## 2019-03-24 DIAGNOSIS — M810 Age-related osteoporosis without current pathological fracture: Secondary | ICD-10-CM | POA: Diagnosis not present

## 2019-03-24 DIAGNOSIS — I10 Essential (primary) hypertension: Secondary | ICD-10-CM | POA: Diagnosis not present

## 2019-03-24 DIAGNOSIS — Z0000 Encounter for general adult medical examination without abnormal findings: Secondary | ICD-10-CM | POA: Diagnosis not present

## 2019-06-28 DIAGNOSIS — R011 Cardiac murmur, unspecified: Secondary | ICD-10-CM | POA: Diagnosis not present

## 2019-06-28 DIAGNOSIS — N390 Urinary tract infection, site not specified: Secondary | ICD-10-CM | POA: Diagnosis not present

## 2019-06-28 DIAGNOSIS — R300 Dysuria: Secondary | ICD-10-CM | POA: Diagnosis not present

## 2019-07-06 DIAGNOSIS — R011 Cardiac murmur, unspecified: Secondary | ICD-10-CM | POA: Diagnosis not present

## 2019-07-06 DIAGNOSIS — N390 Urinary tract infection, site not specified: Secondary | ICD-10-CM | POA: Diagnosis not present

## 2019-07-06 DIAGNOSIS — R300 Dysuria: Secondary | ICD-10-CM | POA: Diagnosis not present

## 2019-07-25 DIAGNOSIS — I10 Essential (primary) hypertension: Secondary | ICD-10-CM | POA: Diagnosis not present

## 2019-07-25 DIAGNOSIS — N390 Urinary tract infection, site not specified: Secondary | ICD-10-CM | POA: Diagnosis not present

## 2019-07-25 DIAGNOSIS — R300 Dysuria: Secondary | ICD-10-CM | POA: Diagnosis not present

## 2019-08-08 DIAGNOSIS — R300 Dysuria: Secondary | ICD-10-CM | POA: Diagnosis not present

## 2019-08-14 DIAGNOSIS — R3912 Poor urinary stream: Secondary | ICD-10-CM | POA: Diagnosis not present

## 2019-08-14 DIAGNOSIS — N401 Enlarged prostate with lower urinary tract symptoms: Secondary | ICD-10-CM | POA: Diagnosis not present

## 2019-08-14 DIAGNOSIS — N403 Nodular prostate with lower urinary tract symptoms: Secondary | ICD-10-CM | POA: Diagnosis not present

## 2019-08-14 DIAGNOSIS — Z87442 Personal history of urinary calculi: Secondary | ICD-10-CM | POA: Diagnosis not present

## 2019-08-14 DIAGNOSIS — N390 Urinary tract infection, site not specified: Secondary | ICD-10-CM | POA: Diagnosis not present

## 2019-08-22 DIAGNOSIS — R3912 Poor urinary stream: Secondary | ICD-10-CM | POA: Diagnosis not present

## 2019-08-22 DIAGNOSIS — Z125 Encounter for screening for malignant neoplasm of prostate: Secondary | ICD-10-CM | POA: Diagnosis not present

## 2019-09-07 DIAGNOSIS — N401 Enlarged prostate with lower urinary tract symptoms: Secondary | ICD-10-CM | POA: Diagnosis not present

## 2019-09-08 DIAGNOSIS — N401 Enlarged prostate with lower urinary tract symptoms: Secondary | ICD-10-CM | POA: Diagnosis not present

## 2019-09-08 DIAGNOSIS — N403 Nodular prostate with lower urinary tract symptoms: Secondary | ICD-10-CM | POA: Diagnosis not present

## 2019-09-08 DIAGNOSIS — R972 Elevated prostate specific antigen [PSA]: Secondary | ICD-10-CM | POA: Diagnosis not present

## 2019-09-26 DIAGNOSIS — E210 Primary hyperparathyroidism: Secondary | ICD-10-CM | POA: Diagnosis not present

## 2019-09-26 DIAGNOSIS — E782 Mixed hyperlipidemia: Secondary | ICD-10-CM | POA: Diagnosis not present

## 2019-09-26 DIAGNOSIS — I10 Essential (primary) hypertension: Secondary | ICD-10-CM | POA: Diagnosis not present

## 2019-09-29 ENCOUNTER — Other Ambulatory Visit: Payer: Self-pay | Admitting: Surgery

## 2019-09-29 DIAGNOSIS — E041 Nontoxic single thyroid nodule: Secondary | ICD-10-CM

## 2019-10-06 DIAGNOSIS — E782 Mixed hyperlipidemia: Secondary | ICD-10-CM | POA: Diagnosis not present

## 2019-10-06 DIAGNOSIS — Z125 Encounter for screening for malignant neoplasm of prostate: Secondary | ICD-10-CM | POA: Diagnosis not present

## 2019-10-06 DIAGNOSIS — N390 Urinary tract infection, site not specified: Secondary | ICD-10-CM | POA: Diagnosis not present

## 2019-10-06 DIAGNOSIS — I10 Essential (primary) hypertension: Secondary | ICD-10-CM | POA: Diagnosis not present

## 2019-10-06 DIAGNOSIS — R300 Dysuria: Secondary | ICD-10-CM | POA: Diagnosis not present

## 2019-10-06 DIAGNOSIS — Z0000 Encounter for general adult medical examination without abnormal findings: Secondary | ICD-10-CM | POA: Diagnosis not present

## 2019-10-23 ENCOUNTER — Other Ambulatory Visit: Payer: Medicare PPO

## 2019-10-26 DIAGNOSIS — N390 Urinary tract infection, site not specified: Secondary | ICD-10-CM | POA: Diagnosis not present

## 2019-11-10 ENCOUNTER — Ambulatory Visit
Admission: RE | Admit: 2019-11-10 | Discharge: 2019-11-10 | Disposition: A | Discharge status: Home / Self Care | Payer: Medicare HMO | Source: Ambulatory Visit | Attending: Surgery | Admitting: Surgery

## 2019-11-10 DIAGNOSIS — E041 Nontoxic single thyroid nodule: Secondary | ICD-10-CM

## 2019-11-10 DIAGNOSIS — E042 Nontoxic multinodular goiter: Secondary | ICD-10-CM | POA: Diagnosis not present

## 2019-11-10 DIAGNOSIS — E210 Primary hyperparathyroidism: Secondary | ICD-10-CM | POA: Diagnosis not present

## 2020-02-06 DIAGNOSIS — H1045 Other chronic allergic conjunctivitis: Secondary | ICD-10-CM | POA: Diagnosis not present

## 2020-02-09 DIAGNOSIS — H579 Unspecified disorder of eye and adnexa: Secondary | ICD-10-CM | POA: Diagnosis not present

## 2020-03-27 DIAGNOSIS — Z0000 Encounter for general adult medical examination without abnormal findings: Secondary | ICD-10-CM | POA: Diagnosis not present

## 2020-03-27 DIAGNOSIS — Z125 Encounter for screening for malignant neoplasm of prostate: Secondary | ICD-10-CM | POA: Diagnosis not present

## 2020-03-27 DIAGNOSIS — M810 Age-related osteoporosis without current pathological fracture: Secondary | ICD-10-CM | POA: Diagnosis not present

## 2020-03-27 DIAGNOSIS — E782 Mixed hyperlipidemia: Secondary | ICD-10-CM | POA: Diagnosis not present

## 2020-03-27 DIAGNOSIS — I10 Essential (primary) hypertension: Secondary | ICD-10-CM | POA: Diagnosis not present

## 2020-03-28 DIAGNOSIS — Z85820 Personal history of malignant melanoma of skin: Secondary | ICD-10-CM | POA: Diagnosis not present

## 2020-03-28 DIAGNOSIS — Z85828 Personal history of other malignant neoplasm of skin: Secondary | ICD-10-CM | POA: Diagnosis not present

## 2020-03-28 DIAGNOSIS — H61009 Unspecified perichondritis of external ear, unspecified ear: Secondary | ICD-10-CM | POA: Diagnosis not present

## 2020-03-28 DIAGNOSIS — L905 Scar conditions and fibrosis of skin: Secondary | ICD-10-CM | POA: Diagnosis not present

## 2020-03-28 DIAGNOSIS — L570 Actinic keratosis: Secondary | ICD-10-CM | POA: Diagnosis not present

## 2020-04-02 DIAGNOSIS — H6123 Impacted cerumen, bilateral: Secondary | ICD-10-CM | POA: Diagnosis not present

## 2020-04-02 DIAGNOSIS — E782 Mixed hyperlipidemia: Secondary | ICD-10-CM | POA: Diagnosis not present

## 2020-04-02 DIAGNOSIS — M810 Age-related osteoporosis without current pathological fracture: Secondary | ICD-10-CM | POA: Diagnosis not present

## 2020-04-02 DIAGNOSIS — I10 Essential (primary) hypertension: Secondary | ICD-10-CM | POA: Diagnosis not present

## 2020-04-02 DIAGNOSIS — Z0000 Encounter for general adult medical examination without abnormal findings: Secondary | ICD-10-CM | POA: Diagnosis not present

## 2020-04-02 DIAGNOSIS — R972 Elevated prostate specific antigen [PSA]: Secondary | ICD-10-CM | POA: Diagnosis not present

## 2020-04-02 DIAGNOSIS — N390 Urinary tract infection, site not specified: Secondary | ICD-10-CM | POA: Diagnosis not present

## 2020-09-05 DIAGNOSIS — Z833 Family history of diabetes mellitus: Secondary | ICD-10-CM | POA: Diagnosis not present

## 2020-09-05 DIAGNOSIS — Z85828 Personal history of other malignant neoplasm of skin: Secondary | ICD-10-CM | POA: Diagnosis not present

## 2020-09-05 DIAGNOSIS — M810 Age-related osteoporosis without current pathological fracture: Secondary | ICD-10-CM | POA: Diagnosis not present

## 2020-09-05 DIAGNOSIS — E785 Hyperlipidemia, unspecified: Secondary | ICD-10-CM | POA: Diagnosis not present

## 2020-09-05 DIAGNOSIS — Z8249 Family history of ischemic heart disease and other diseases of the circulatory system: Secondary | ICD-10-CM | POA: Diagnosis not present

## 2020-09-05 DIAGNOSIS — Z809 Family history of malignant neoplasm, unspecified: Secondary | ICD-10-CM | POA: Diagnosis not present

## 2020-09-05 DIAGNOSIS — R3129 Other microscopic hematuria: Secondary | ICD-10-CM | POA: Diagnosis not present

## 2020-09-05 DIAGNOSIS — Z823 Family history of stroke: Secondary | ICD-10-CM | POA: Diagnosis not present

## 2020-09-05 DIAGNOSIS — N400 Enlarged prostate without lower urinary tract symptoms: Secondary | ICD-10-CM | POA: Diagnosis not present

## 2020-09-05 DIAGNOSIS — R350 Frequency of micturition: Secondary | ICD-10-CM | POA: Diagnosis not present

## 2020-09-05 DIAGNOSIS — N403 Nodular prostate with lower urinary tract symptoms: Secondary | ICD-10-CM | POA: Diagnosis not present

## 2020-09-05 DIAGNOSIS — I10 Essential (primary) hypertension: Secondary | ICD-10-CM | POA: Diagnosis not present

## 2020-09-05 DIAGNOSIS — I951 Orthostatic hypotension: Secondary | ICD-10-CM | POA: Diagnosis not present

## 2020-09-05 DIAGNOSIS — N401 Enlarged prostate with lower urinary tract symptoms: Secondary | ICD-10-CM | POA: Diagnosis not present

## 2020-09-13 DIAGNOSIS — E782 Mixed hyperlipidemia: Secondary | ICD-10-CM | POA: Diagnosis not present

## 2020-09-13 DIAGNOSIS — M810 Age-related osteoporosis without current pathological fracture: Secondary | ICD-10-CM | POA: Diagnosis not present

## 2020-09-13 DIAGNOSIS — I10 Essential (primary) hypertension: Secondary | ICD-10-CM | POA: Diagnosis not present

## 2020-09-13 DIAGNOSIS — Z23 Encounter for immunization: Secondary | ICD-10-CM | POA: Diagnosis not present

## 2020-09-18 DIAGNOSIS — N400 Enlarged prostate without lower urinary tract symptoms: Secondary | ICD-10-CM | POA: Diagnosis not present

## 2020-09-18 DIAGNOSIS — I700 Atherosclerosis of aorta: Secondary | ICD-10-CM | POA: Diagnosis not present

## 2020-09-18 DIAGNOSIS — I70202 Unspecified atherosclerosis of native arteries of extremities, left leg: Secondary | ICD-10-CM | POA: Diagnosis not present

## 2020-09-23 DIAGNOSIS — Z9889 Other specified postprocedural states: Secondary | ICD-10-CM | POA: Diagnosis not present

## 2020-09-23 DIAGNOSIS — R1909 Other intra-abdominal and pelvic swelling, mass and lump: Secondary | ICD-10-CM | POA: Diagnosis not present

## 2020-10-18 DIAGNOSIS — N400 Enlarged prostate without lower urinary tract symptoms: Secondary | ICD-10-CM | POA: Diagnosis not present

## 2021-02-11 DIAGNOSIS — I10 Essential (primary) hypertension: Secondary | ICD-10-CM | POA: Diagnosis not present

## 2021-02-11 DIAGNOSIS — D709 Neutropenia, unspecified: Secondary | ICD-10-CM | POA: Diagnosis not present

## 2021-02-11 DIAGNOSIS — R238 Other skin changes: Secondary | ICD-10-CM | POA: Diagnosis not present

## 2021-02-11 DIAGNOSIS — R197 Diarrhea, unspecified: Secondary | ICD-10-CM | POA: Diagnosis not present

## 2021-02-11 DIAGNOSIS — R195 Other fecal abnormalities: Secondary | ICD-10-CM | POA: Diagnosis not present

## 2021-02-11 DIAGNOSIS — R634 Abnormal weight loss: Secondary | ICD-10-CM | POA: Diagnosis not present

## 2021-02-11 DIAGNOSIS — E782 Mixed hyperlipidemia: Secondary | ICD-10-CM | POA: Diagnosis not present

## 2021-02-11 DIAGNOSIS — R21 Rash and other nonspecific skin eruption: Secondary | ICD-10-CM | POA: Diagnosis not present

## 2021-02-11 DIAGNOSIS — R5383 Other fatigue: Secondary | ICD-10-CM | POA: Diagnosis not present

## 2021-03-12 DIAGNOSIS — N390 Urinary tract infection, site not specified: Secondary | ICD-10-CM | POA: Diagnosis not present

## 2021-03-18 IMAGING — NM NM PARATHYROID W/ SPECT
3 series · 18 of 18 positions shown · non-contrast
Comparison: Thyroid ultrasound same day

CLINICAL DATA: NM Parathyroid with spect due to primary
hyperparathyroidism Pt was injected with 27.5 mCi of Gc-BBm
Sestamibi and imaged 15 min and 2 hour post injection. Ordering
physician's office unable to provide lab values. Pt states
th.*comment was truncated*^08.8millicurie CARDIOLITE TECHNETIUM
TC 99M SESTAMIBI - CARDIOLITE

EXAM:
NM PARATHYROID SCINTIGRAPHY AND SPECT IMAGING
TECHNIQUE: Following intravenous administration of radiopharmaceutical, early
and 2-hour delayed planar images were obtained in the anterior
projection. Delayed triplanar SPECT images were also obtained at 2
hours.
RADIOPHARMACEUTICALS:  27.5 mCi Gc-BBm Sestamibi IV

[Series 1: spect - (id)_(id)_tra · 4.1mm · 4.14mm/px · 6 of 128 frames shown]
[frame 11/128]
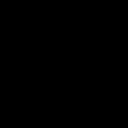
[frame 32/128]
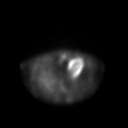
[frame 54/128]
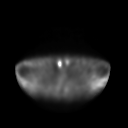
[frame 75/128]
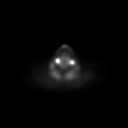
[frame 96/128]
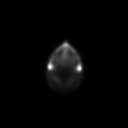
[frame 118/128]
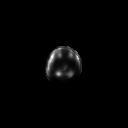

[Series 1: spect - (id)_(id)_cor · 4.1mm · 4.14mm/px · 6 of 128 frames shown]
[frame 11/128]
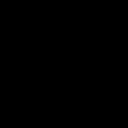
[frame 32/128]
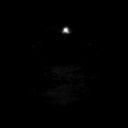
[frame 54/128]
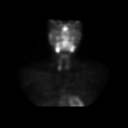
[frame 75/128]
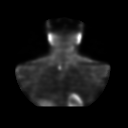
[frame 96/128]
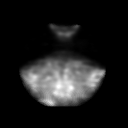
[frame 118/128]
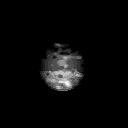

[Series 2: spect parathyroid · 4.14mm/px · 6 of 64 frames shown]
[frame 6/64]
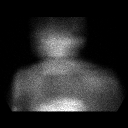
[frame 16/64]
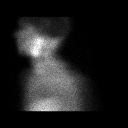
[frame 27/64]
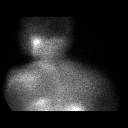
[frame 38/64]
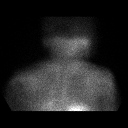
[frame 48/64]
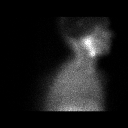
[frame 59/64]
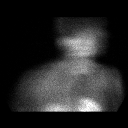

[18 of 18 positions shown; findings below may reference images not displayed]

FINDINGS: On delayed 2 hour imaging, there is retention of sestamibi within
the lower pole of the RIGHT lobe of thyroid gland. Washout of
activity from the remainder the LEFT and RIGHT gland. SPECT imaging
of the neck confirms activity inferior to the lower pole of the
RIGHT lobe of the thyroid gland. Lesions expected to measure
approximately 2 cm.
IMPRESSION: An approximately 2 cm lesion inferior to the RIGHT lobe of the
thyroid gland is suspicious for parathyroid adenoma.

## 2021-03-18 IMAGING — US US THYROID
1 series · 13 of 25 positions shown · non-contrast
Comparison: 10/25/2018

CLINICAL DATA: Primary hyperparathyroidism. Positive nuclear
medicine parathyroid scan for a right parathyroid adenoma.

EXAM:
THYROID ULTRASOUND
TECHNIQUE: Ultrasound examination of the thyroid gland and adjacent soft
tissues was performed.

[Series 1: us thyroid · 53 acquisitions, 13 frames shown]
[im 1/53]
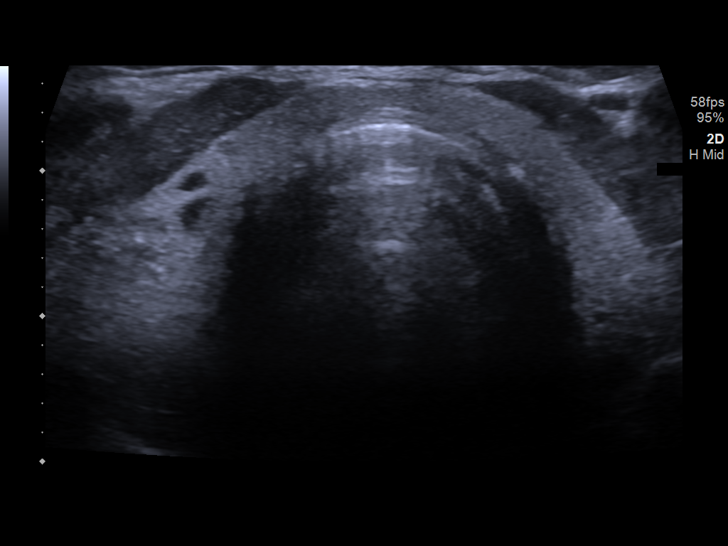
[im 5/53]
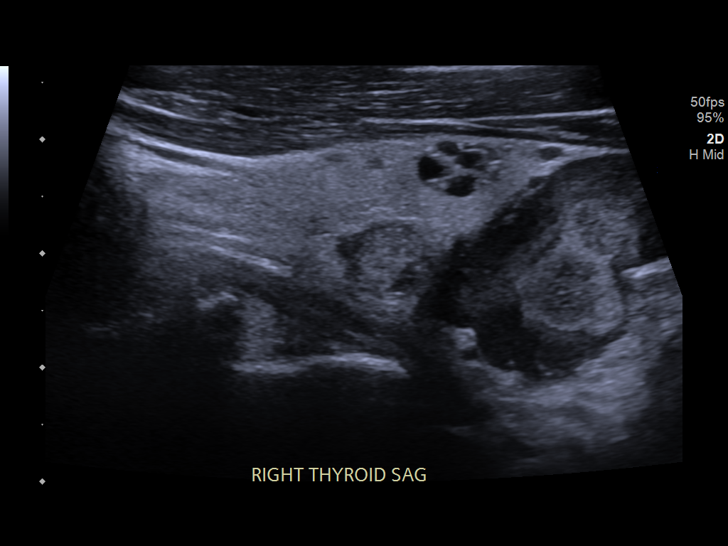
[im 9/53]
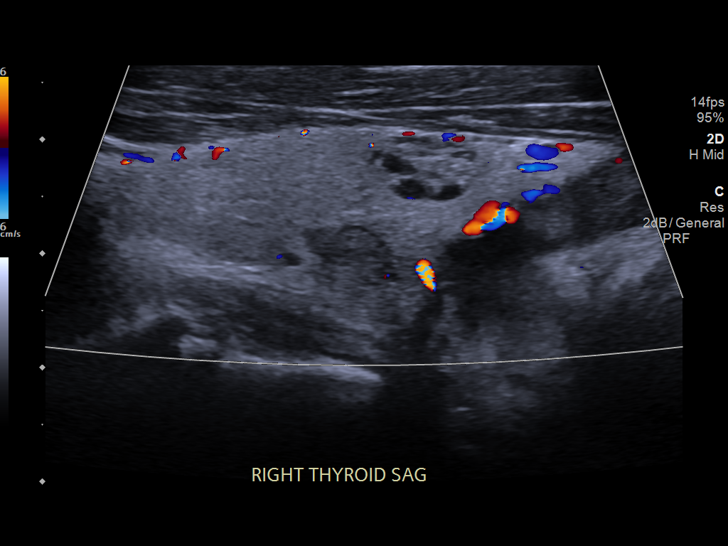
[im 14/53]
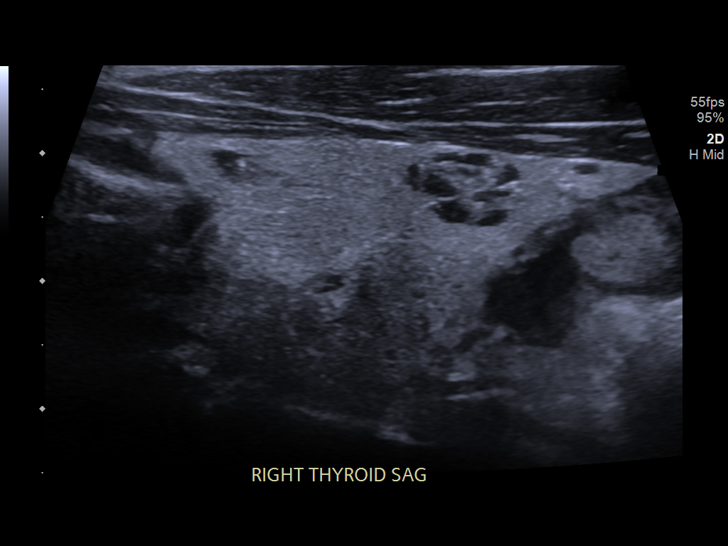
[im 18/53]
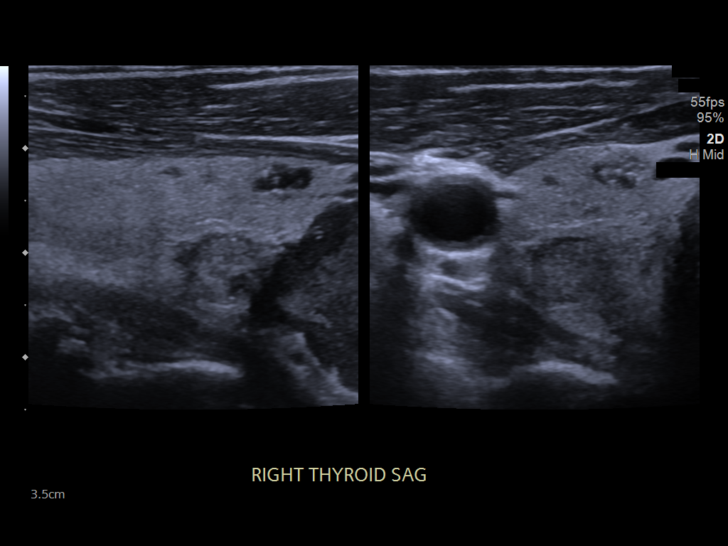
[im 22/53]
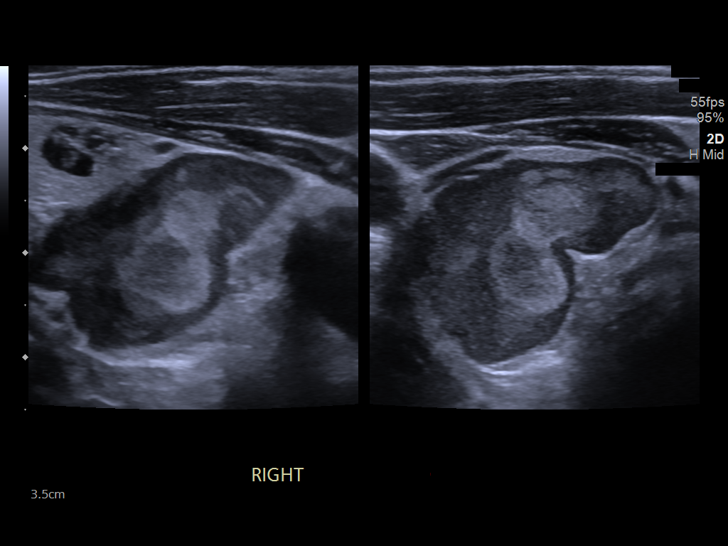
[im 27/53]
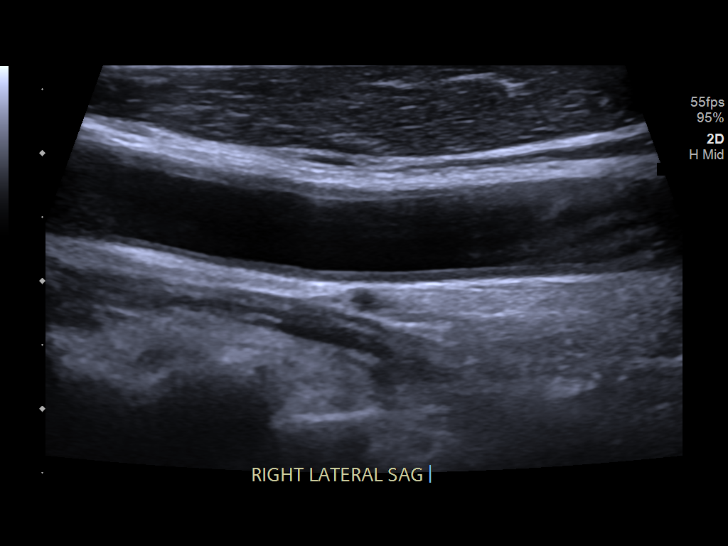
[im 31/53]
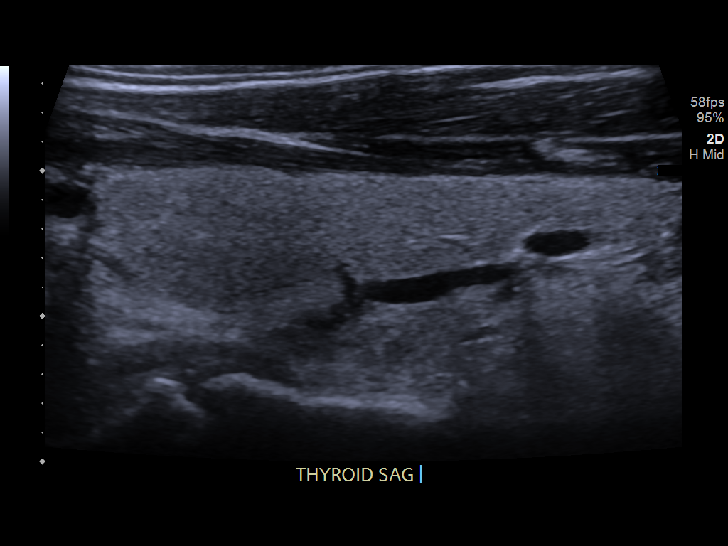
[im 35/53]
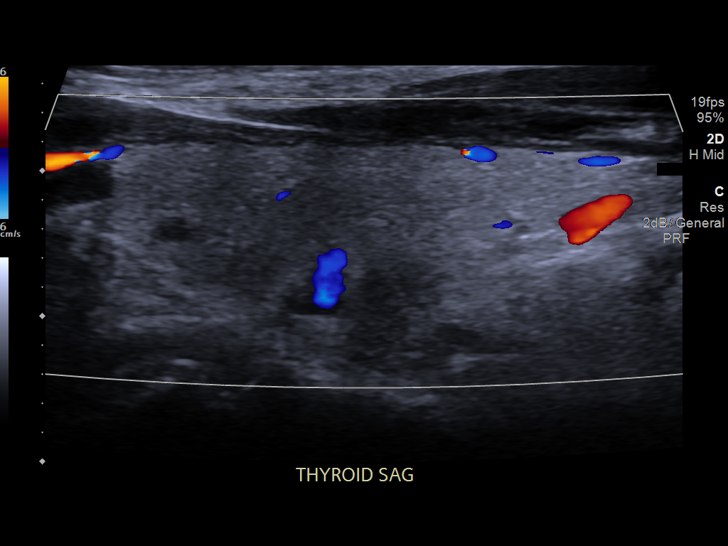
[im 40/53]
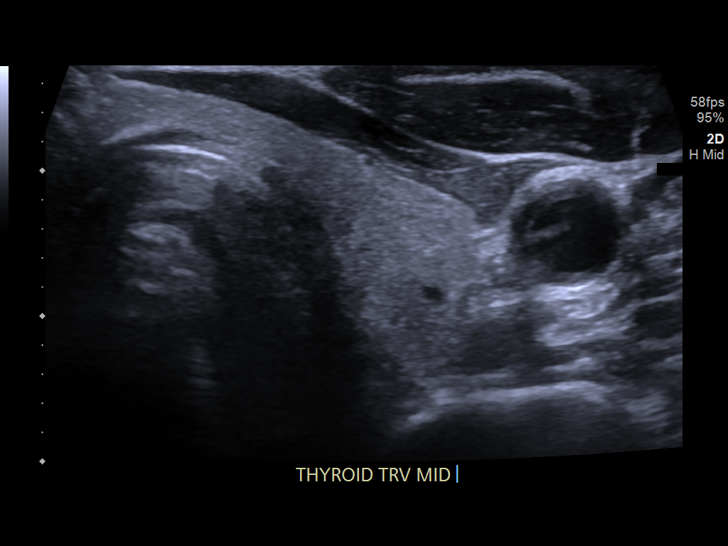
[im 44/53]
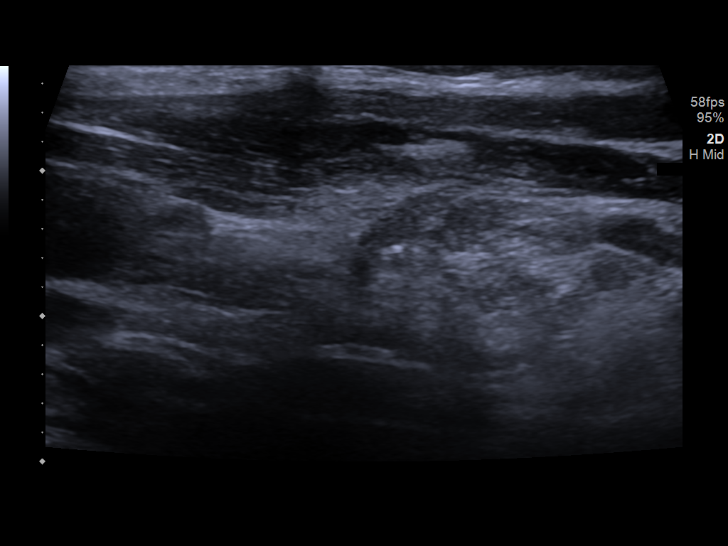
[im 48/53]
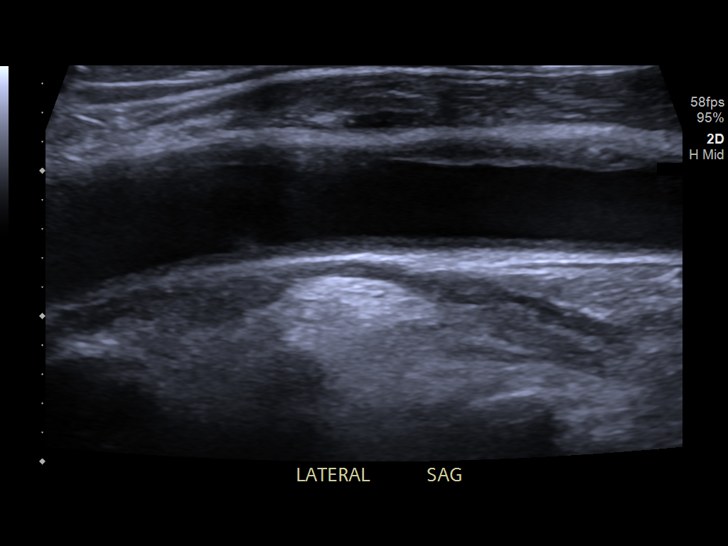
[im 53/53]
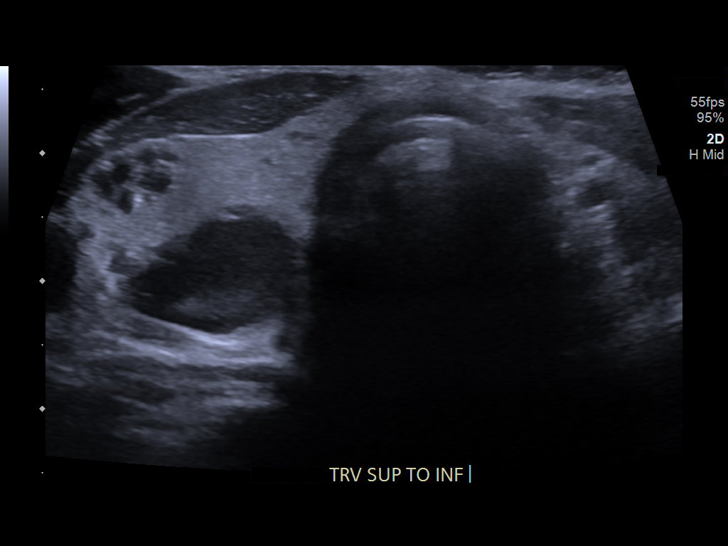

[13 of 25 positions shown; findings below may reference images not displayed]

FINDINGS: Parenchymal Echotexture: Mildly heterogenous

Isthmus: 2 mm

Right lobe: 4.7 x 1.9 x 1.6 cm

Left lobe: 3.8 x 1.7 x 0.9 cm

_________________________________________________________

Estimated total number of nodules >/= 1 cm: 2

Number of spongiform nodules >/=  2 cm not described below (TR1): 0

Number of mixed cystic and solid nodules >/= 1.5 cm not described
below (TR2): 0

_________________________________________________________

Nodule # 1:

Location: Right; Inferior

Maximum size: 1.1 cm; Other 2 dimensions: 0.9 x 0.6 cm

Composition: mixed cystic and solid (1)

Echogenicity: isoechoic (1)

Shape: not taller-than-wide (0)

Margins: smooth (0)

Echogenic foci: none (0)

ACR TI-RADS total points: 2.

ACR TI-RADS risk category: TR2 (2 points).

ACR TI-RADS recommendations:

This nodule does NOT meet TI-RADS criteria for biopsy or dedicated
follow-up.

_________________________________________________________

Nodule # 2:

Location: Right; Inferior

Maximum size: 1.1 cm; Other 2 dimensions: 0.9 x 0.7 cm

Composition: solid/almost completely solid (2)

Echogenicity: hypoechoic (2)

Shape: not taller-than-wide (0)

Margins: ill-defined (0)

Echogenic foci: none (0)

ACR TI-RADS total points: 4.

ACR TI-RADS risk category: TR4 (4-6 points).

ACR TI-RADS recommendations:

*Given size (>/= 1 - 1.4 cm) and appearance, a follow-up ultrasound
in 1 year should be considered based on TI-RADS criteria.

_________________________________________________________

Small subcentimeter left thyroid cystic nodules noted all measuring
5 mm or less. These would not meet criteria for any biopsy or
follow-up. Normal vascularity.

Along the right inferior thyroid, there is a mixed echogenicity
lobulated solid mass measuring 2.8 x 2.7 x 1.9 cm. This is in the
expected location of a right inferior parathyroid and correlates
with the parathyroid scan. Findings compatible with right inferior
parathyroid adenoma.
IMPRESSION: 2.8 cm right inferior parathyroid mass compatible with parathyroid
adenoma when correlated with the nuclear medicine parathyroid scan.

1.1 cm right inferior TR 4 nodule meets criteria follow-up in 1
year.

The above is in keeping with the ACR TI-RADS recommendations - [HOSPITAL] 2411;[DATE].

## 2021-09-19 DIAGNOSIS — E210 Primary hyperparathyroidism: Secondary | ICD-10-CM | POA: Diagnosis not present

## 2021-09-19 DIAGNOSIS — Z125 Encounter for screening for malignant neoplasm of prostate: Secondary | ICD-10-CM | POA: Diagnosis not present

## 2021-09-19 DIAGNOSIS — I10 Essential (primary) hypertension: Secondary | ICD-10-CM | POA: Diagnosis not present

## 2021-09-19 DIAGNOSIS — E782 Mixed hyperlipidemia: Secondary | ICD-10-CM | POA: Diagnosis not present

## 2021-09-19 DIAGNOSIS — E559 Vitamin D deficiency, unspecified: Secondary | ICD-10-CM | POA: Diagnosis not present

## 2021-09-30 DIAGNOSIS — Z0000 Encounter for general adult medical examination without abnormal findings: Secondary | ICD-10-CM | POA: Diagnosis not present

## 2021-09-30 DIAGNOSIS — I10 Essential (primary) hypertension: Secondary | ICD-10-CM | POA: Diagnosis not present

## 2021-09-30 DIAGNOSIS — M810 Age-related osteoporosis without current pathological fracture: Secondary | ICD-10-CM | POA: Diagnosis not present

## 2021-09-30 DIAGNOSIS — R972 Elevated prostate specific antigen [PSA]: Secondary | ICD-10-CM | POA: Diagnosis not present

## 2021-09-30 DIAGNOSIS — E782 Mixed hyperlipidemia: Secondary | ICD-10-CM | POA: Diagnosis not present

## 2021-09-30 DIAGNOSIS — N390 Urinary tract infection, site not specified: Secondary | ICD-10-CM | POA: Diagnosis not present

## 2022-04-03 IMAGING — US US THYROID
1 series · 13 of 25 positions shown · non-contrast
Comparison: 10/25/2018 ultrasound and scintigraphy

CLINICAL DATA: Primary hyperparathyroidism, status post inferior
right parathyroidectomy.

EXAM:
THYROID ULTRASOUND
TECHNIQUE: Ultrasound examination of the thyroid gland and adjacent soft
tissues was performed.

[Series 1: us thyroid · 0.05mm/px · 13 of 37 slices shown]
[im 1/37]
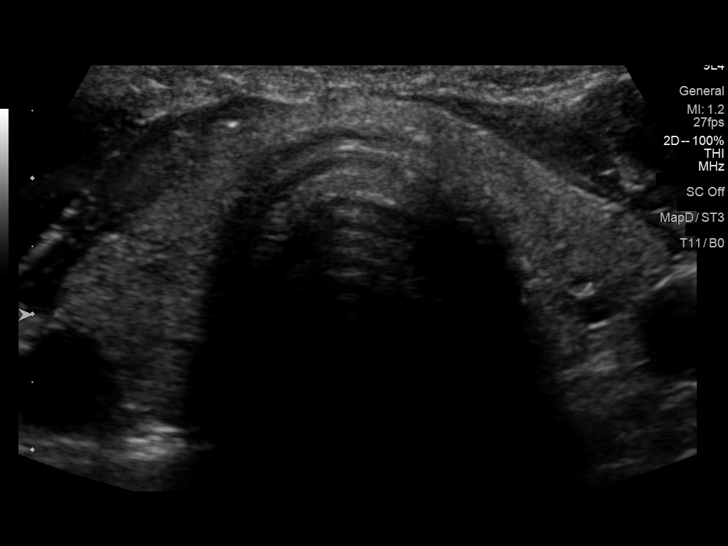
[im 4/37]
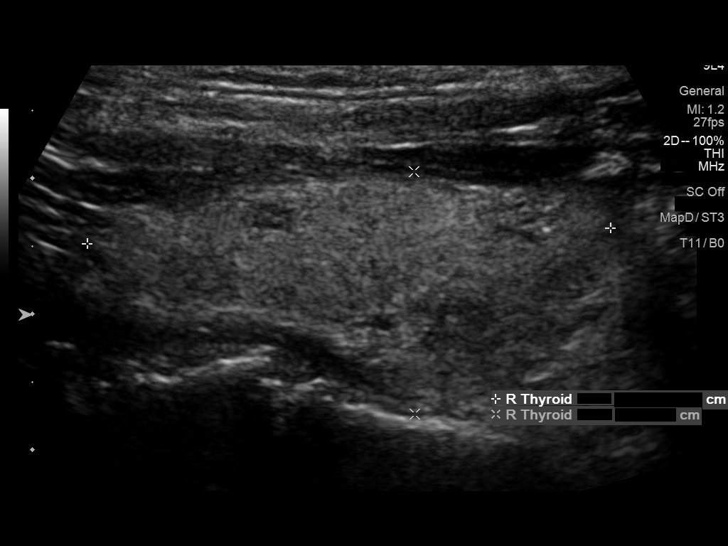
[im 7/37]
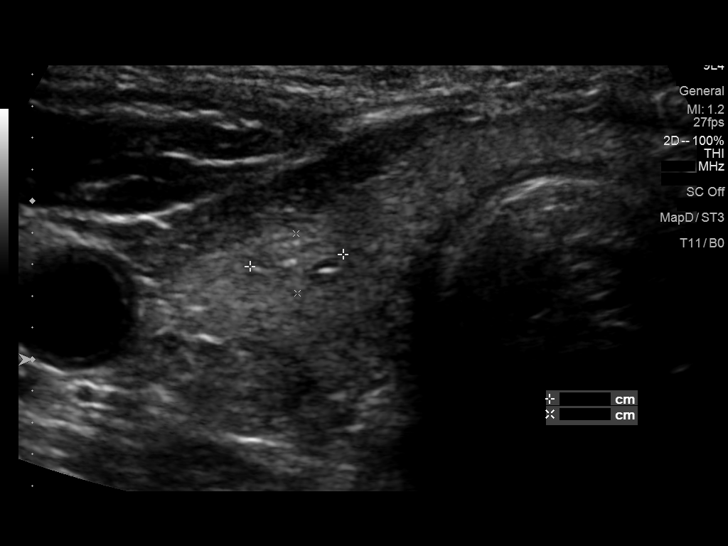
[im 10/37]
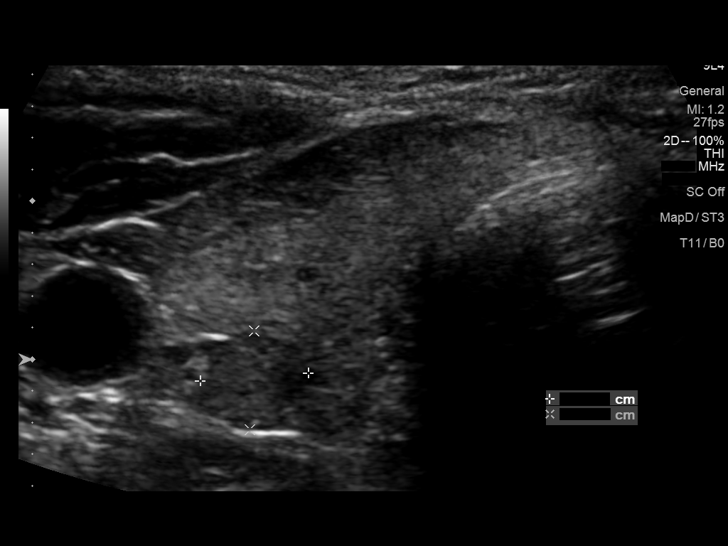
[im 13/37]
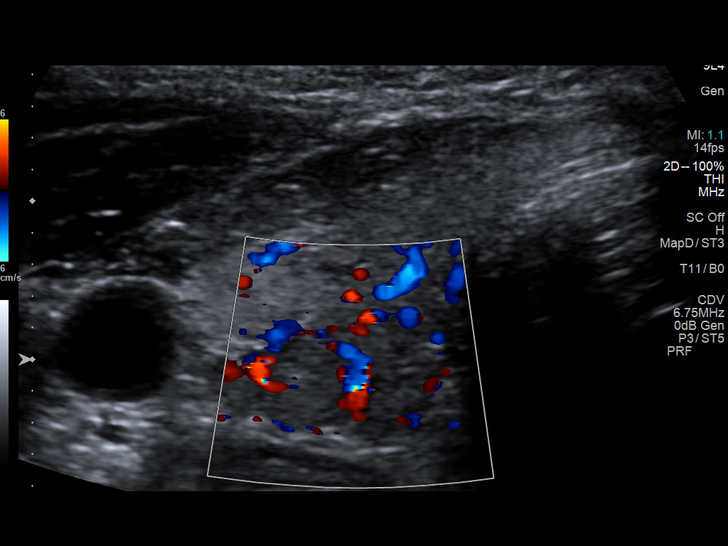
[im 16/37]
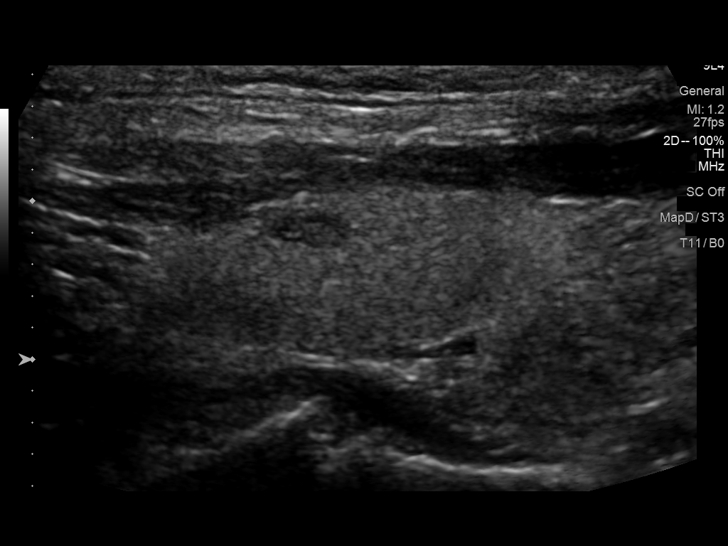
[im 19/37]
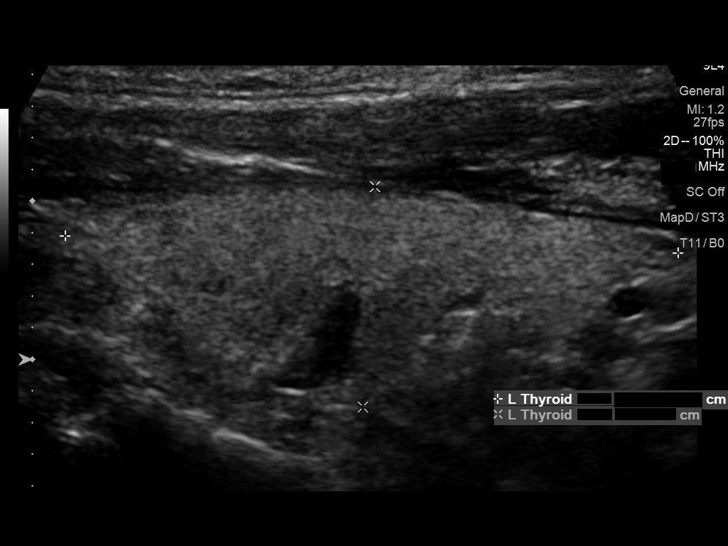
[im 22/37]
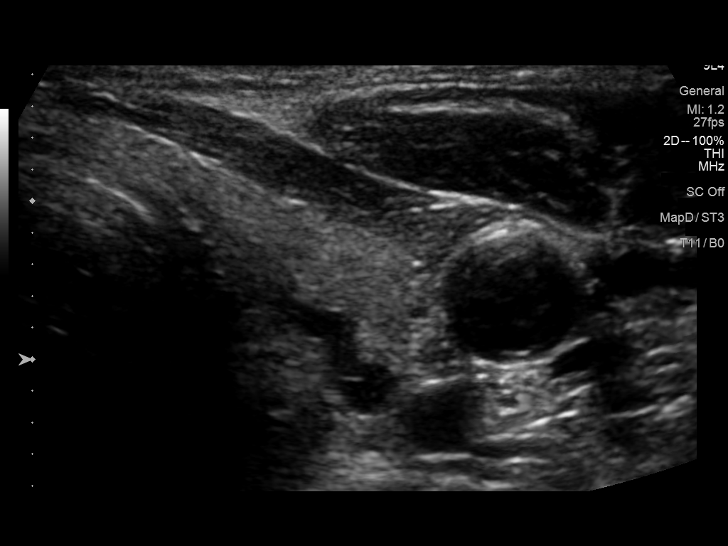
[im 25/37]
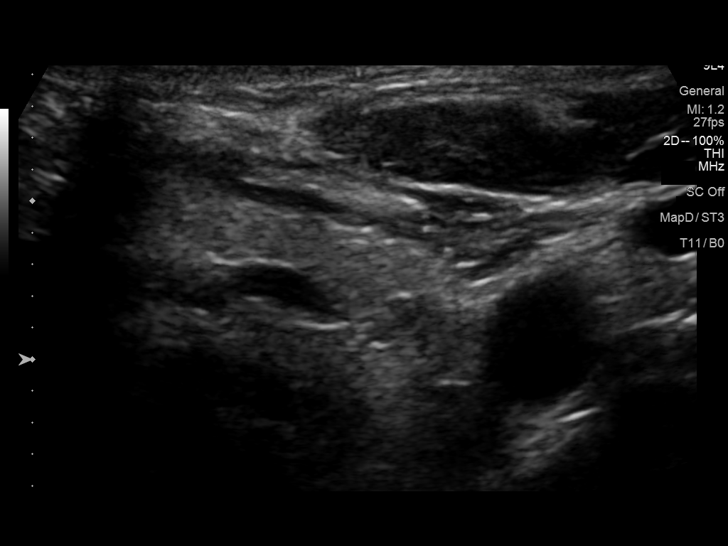
[im 28/37]
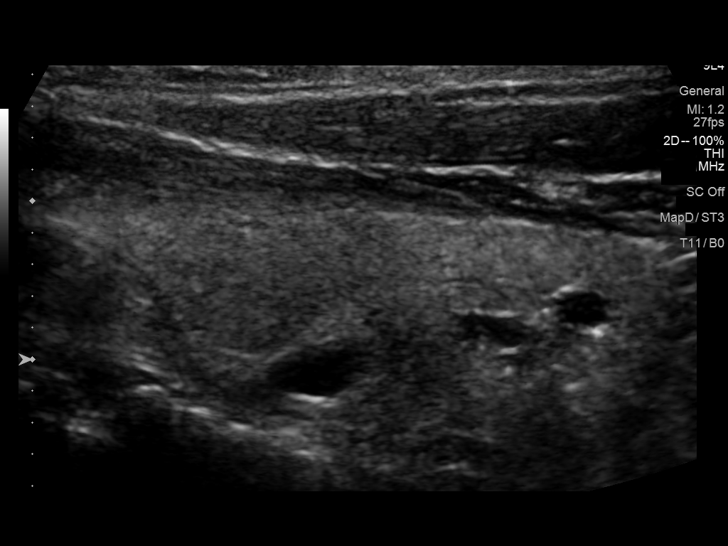
[im 31/37]
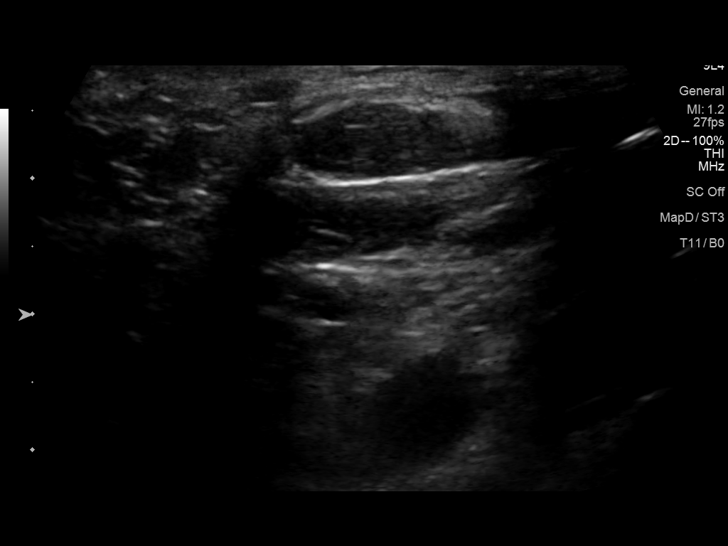
[im 34/37]
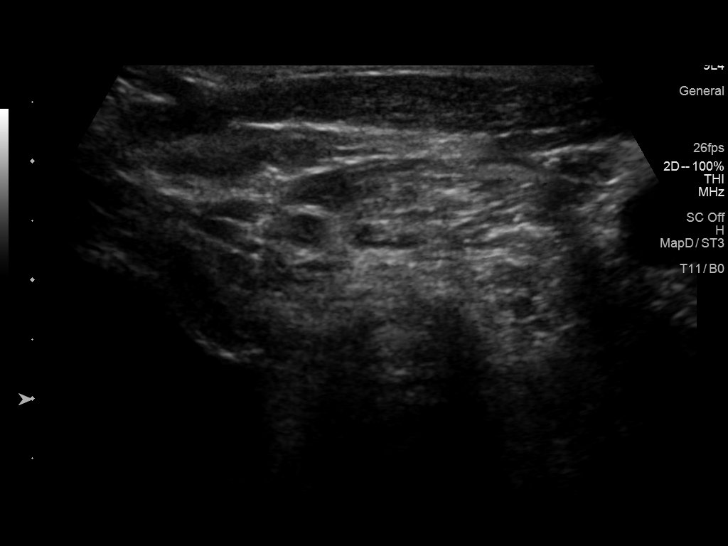
[im 37/37]
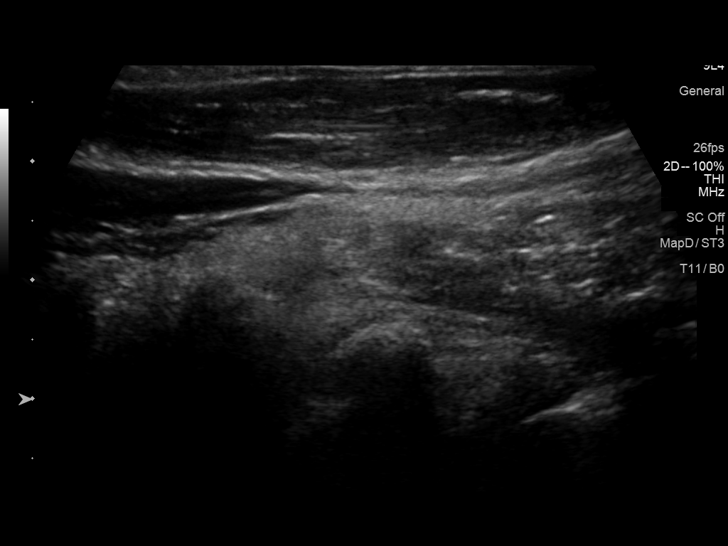

[13 of 25 positions shown; findings below may reference images not displayed]

FINDINGS: Parenchymal Echotexture: Mildly heterogenous

Isthmus: 0.3 cm thickness, previously

Right lobe: 4 x 1.8 x 1.6 cm, previously 4.7 x 1.9 x

Left lobe: 4 x 1.4 x 1.4 cm, previously 3.8 x 1.7 x

_________________________________________________________

Estimated total number of nodules >/= 1 cm: 0

Number of spongiform nodules >/=  2 cm not described below (TR1): 0

Number of mixed cystic and solid nodules >/= 1.5 cm not described
below (TR2): 0

_________________________________________________________

0.8 cm isoechoic nodule without calcifications, mid right
(previously 1 cm); This nodule does NOT meet TI-RADS criteria for
biopsy or dedicated follow-up.

0.9 cm hypoechoic nodule without calcifications, inferior right
(previously 1 cm); This nodule does NOT meet TI-RADS criteria for
biopsy or dedicated follow-up.

No regional adenopathy identified. The right parathyroid mass seen
previously is not identified.
IMPRESSION: 1. Stable thyroid with subcentimeter right nodules which do not meet
criteria for biopsy or follow-up.
2. Interval right parathyroidectomy.

The findings were telephoned to the patient as requested.

The above is in keeping with the ACR TI-RADS recommendations - [HOSPITAL] 6168;[DATE].

## 2022-08-13 ENCOUNTER — Other Ambulatory Visit (HOSPITAL_BASED_OUTPATIENT_CLINIC_OR_DEPARTMENT_OTHER): Payer: Self-pay | Admitting: Internal Medicine

## 2022-08-13 DIAGNOSIS — L0390 Cellulitis, unspecified: Secondary | ICD-10-CM | POA: Diagnosis not present

## 2022-08-13 DIAGNOSIS — R0609 Other forms of dyspnea: Secondary | ICD-10-CM

## 2022-08-13 DIAGNOSIS — R002 Palpitations: Secondary | ICD-10-CM

## 2022-08-13 DIAGNOSIS — E782 Mixed hyperlipidemia: Secondary | ICD-10-CM | POA: Diagnosis not present

## 2022-08-18 DIAGNOSIS — N182 Chronic kidney disease, stage 2 (mild): Secondary | ICD-10-CM | POA: Diagnosis not present

## 2022-08-18 DIAGNOSIS — I129 Hypertensive chronic kidney disease with stage 1 through stage 4 chronic kidney disease, or unspecified chronic kidney disease: Secondary | ICD-10-CM | POA: Diagnosis not present

## 2022-08-18 DIAGNOSIS — Z85820 Personal history of malignant melanoma of skin: Secondary | ICD-10-CM | POA: Diagnosis not present

## 2022-08-18 DIAGNOSIS — Z85828 Personal history of other malignant neoplasm of skin: Secondary | ICD-10-CM | POA: Diagnosis not present

## 2022-08-18 DIAGNOSIS — Z833 Family history of diabetes mellitus: Secondary | ICD-10-CM | POA: Diagnosis not present

## 2022-08-18 DIAGNOSIS — Z888 Allergy status to other drugs, medicaments and biological substances status: Secondary | ICD-10-CM | POA: Diagnosis not present

## 2022-08-18 DIAGNOSIS — Z823 Family history of stroke: Secondary | ICD-10-CM | POA: Diagnosis not present

## 2022-08-18 DIAGNOSIS — N400 Enlarged prostate without lower urinary tract symptoms: Secondary | ICD-10-CM | POA: Diagnosis not present

## 2022-08-18 DIAGNOSIS — E785 Hyperlipidemia, unspecified: Secondary | ICD-10-CM | POA: Diagnosis not present

## 2022-08-18 DIAGNOSIS — Z809 Family history of malignant neoplasm, unspecified: Secondary | ICD-10-CM | POA: Diagnosis not present

## 2022-08-18 DIAGNOSIS — Z008 Encounter for other general examination: Secondary | ICD-10-CM | POA: Diagnosis not present

## 2022-08-18 DIAGNOSIS — Z8249 Family history of ischemic heart disease and other diseases of the circulatory system: Secondary | ICD-10-CM | POA: Diagnosis not present

## 2022-08-27 ENCOUNTER — Ambulatory Visit (HOSPITAL_BASED_OUTPATIENT_CLINIC_OR_DEPARTMENT_OTHER)
Admission: RE | Admit: 2022-08-27 | Discharge: 2022-08-27 | Disposition: A | Discharge status: Home / Self Care | Payer: Medicare HMO | Source: Ambulatory Visit | Attending: Internal Medicine | Admitting: Internal Medicine

## 2022-08-27 DIAGNOSIS — E782 Mixed hyperlipidemia: Secondary | ICD-10-CM | POA: Insufficient documentation

## 2022-08-27 DIAGNOSIS — R0609 Other forms of dyspnea: Secondary | ICD-10-CM | POA: Insufficient documentation

## 2022-08-27 DIAGNOSIS — R002 Palpitations: Secondary | ICD-10-CM | POA: Insufficient documentation

## 2022-09-10 DIAGNOSIS — I2510 Atherosclerotic heart disease of native coronary artery without angina pectoris: Secondary | ICD-10-CM | POA: Diagnosis not present

## 2022-09-10 DIAGNOSIS — R300 Dysuria: Secondary | ICD-10-CM | POA: Diagnosis not present

## 2022-09-10 DIAGNOSIS — E7801 Familial hypercholesterolemia: Secondary | ICD-10-CM | POA: Diagnosis not present

## 2022-09-10 DIAGNOSIS — G720 Drug-induced myopathy: Secondary | ICD-10-CM | POA: Diagnosis not present

## 2022-09-10 DIAGNOSIS — M810 Age-related osteoporosis without current pathological fracture: Secondary | ICD-10-CM | POA: Diagnosis not present

## 2022-09-10 DIAGNOSIS — E782 Mixed hyperlipidemia: Secondary | ICD-10-CM | POA: Diagnosis not present

## 2022-09-10 DIAGNOSIS — I10 Essential (primary) hypertension: Secondary | ICD-10-CM | POA: Diagnosis not present

## 2022-09-10 DIAGNOSIS — R69 Illness, unspecified: Secondary | ICD-10-CM | POA: Diagnosis not present

## 2022-10-07 DIAGNOSIS — E210 Primary hyperparathyroidism: Secondary | ICD-10-CM | POA: Diagnosis not present

## 2022-10-07 DIAGNOSIS — I2510 Atherosclerotic heart disease of native coronary artery without angina pectoris: Secondary | ICD-10-CM | POA: Diagnosis not present

## 2022-10-07 DIAGNOSIS — E782 Mixed hyperlipidemia: Secondary | ICD-10-CM | POA: Diagnosis not present

## 2022-10-07 DIAGNOSIS — R972 Elevated prostate specific antigen [PSA]: Secondary | ICD-10-CM | POA: Diagnosis not present

## 2022-10-07 DIAGNOSIS — I10 Essential (primary) hypertension: Secondary | ICD-10-CM | POA: Diagnosis not present

## 2022-10-08 DIAGNOSIS — Z23 Encounter for immunization: Secondary | ICD-10-CM | POA: Diagnosis not present

## 2022-10-14 DIAGNOSIS — E782 Mixed hyperlipidemia: Secondary | ICD-10-CM | POA: Diagnosis not present

## 2022-10-14 DIAGNOSIS — M810 Age-related osteoporosis without current pathological fracture: Secondary | ICD-10-CM | POA: Diagnosis not present

## 2022-10-14 DIAGNOSIS — I10 Essential (primary) hypertension: Secondary | ICD-10-CM | POA: Diagnosis not present

## 2022-10-14 DIAGNOSIS — N182 Chronic kidney disease, stage 2 (mild): Secondary | ICD-10-CM | POA: Diagnosis not present

## 2022-10-14 DIAGNOSIS — Z0000 Encounter for general adult medical examination without abnormal findings: Secondary | ICD-10-CM | POA: Diagnosis not present

## 2022-10-14 DIAGNOSIS — R5383 Other fatigue: Secondary | ICD-10-CM | POA: Diagnosis not present

## 2022-10-14 DIAGNOSIS — R972 Elevated prostate specific antigen [PSA]: Secondary | ICD-10-CM | POA: Diagnosis not present

## 2022-10-14 DIAGNOSIS — N390 Urinary tract infection, site not specified: Secondary | ICD-10-CM | POA: Diagnosis not present

## 2022-12-08 ENCOUNTER — Ambulatory Visit: Payer: Medicare HMO | Attending: Cardiology | Admitting: Cardiology

## 2022-12-08 ENCOUNTER — Encounter: Payer: Self-pay | Admitting: Cardiology

## 2022-12-08 VITALS — BP 142/78 | HR 78 | Resp 15 | Ht 66.0 in | Wt 145.0 lb

## 2022-12-08 DIAGNOSIS — R931 Abnormal findings on diagnostic imaging of heart and coronary circulation: Secondary | ICD-10-CM | POA: Insufficient documentation

## 2022-12-08 DIAGNOSIS — I2510 Atherosclerotic heart disease of native coronary artery without angina pectoris: Secondary | ICD-10-CM | POA: Insufficient documentation

## 2022-12-08 DIAGNOSIS — I25118 Atherosclerotic heart disease of native coronary artery with other forms of angina pectoris: Secondary | ICD-10-CM

## 2022-12-08 DIAGNOSIS — R6889 Other general symptoms and signs: Secondary | ICD-10-CM | POA: Insufficient documentation

## 2022-12-08 DIAGNOSIS — E782 Mixed hyperlipidemia: Secondary | ICD-10-CM | POA: Diagnosis not present

## 2022-12-08 NOTE — Patient Instructions (Signed)
Medication Instructions:   Your physician recommends that you continue on your current medications as directed. Please refer to the Current Medication list given to you today.  *If you need a refill on your cardiac medications before your next appointment, please call your pharmacy*   Lab Work:  IN 3 MONTHS HERE IN THE OFFICE (AROUND 03/10/2023) --LIPIDS--PLEASE COME FASTING   If you have labs (blood work) drawn today and your tests are completely normal, you will receive your results only by: MyChart Message (if you have MyChart) OR A paper copy in the mail If you have any lab test that is abnormal or we need to change your treatment, we will call you to review the results.    Testing/Procedures:  Your physician has requested that you have a stress echocardiogram. For further information please visit [email or web address] Please follow instruction sheet as given.  Please note: We ask at that you not bring children with you during ultrasound (echo/ vascular) testing. Due to room size and safety concerns, children are not allowed in the ultrasound rooms during exams. Our front office staff cannot provide observation of children in our lobby area while testing is being conducted. An adult accompanying a patient to their appointment will only be allowed in the ultrasound room at the discretion of the ultrasound technician under special circumstances. We apologize for any inconvenience.    Follow-Up:  3 MONTHS WITH DR. PATWARDHAN

## 2022-12-08 NOTE — Progress Notes (Signed)
Cardiology Office Note:  .   Date:  12/08/2022  ID:  Craig Christian, DOB 01-02-1951, MRN [id number] PCP: Craig Fick, MD  [facility] Providers Cardiologist:  Craig Mainland, MD PCP: Craig Fick, MD  Chief Complaint  Patient presents with   Elevated coronary artery calcium score<9>      History of Present Illness: .    Craig Christian is a 72 y.o. 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  He has noticed decreased exercise tolerance over a period of time.  For example, he is not able to keep up with playing his grandchildren as well as he did about a year ago.  Recent calcium score scan showed mildly elevated calcium score.  Blood pressure elevated today, but generally very well-controlled at home.  He is currently on Repatha for hyperlipidemia.  He last had an echocardiogram in 2021  Vitals:   12/08/22 1058  BP: (!) 142/78  Pulse: 78  Resp: 15  SpO2: 94%     ROS:  Review of Systems  Cardiovascular:  Negative for chest pain, dyspnea on exertion, leg swelling, palpitations and syncope.       Decreased exercise tolerance     Studies Reviewed: Marland Kitchen       EKG 12/08/2022: Normal sinus rhythm Normal ECG When compared with ECG of 28-Dec-2018 11:15, No significant change was found    CT cardiac scoring 08/27/2022: LM: 0 LAD: 0.953 LCx: 4.76 RCA: 19.1 Total 24..8 (19th percentile)  Labs 09/2022: Chol 169, TG 219, HDL 46, LDL 86   Physical Exam:   Physical Exam Vitals and nursing note reviewed.  Constitutional:      General: He is not in acute distress. Neck:     Vascular: No JVD.  Cardiovascular:     Rate and Rhythm: Normal rate and regular rhythm.     Heart sounds: Murmur heard.     High-pitched blowing holosystolic murmur is present with a grade of 1/6 at the apex.  Pulmonary:     Effort: Pulmonary effort is normal.     Breath sounds: Normal breath sounds. No  wheezing or rales.  Musculoskeletal:     Right lower leg: No edema.     Left lower leg: No edema.      VISIT DIAGNOSES:   ICD-10-CM   1. Elevated coronary artery calcium score  R93.1 EKG 12-Lead    ECHOCARDIOGRAM STRESS TEST    Lipid Profile    Lipid Profile    Cardiac Stress Test: Informed Consent Details: Physician/Practitioner Attestation; Transcribe to consent form and obtain patient signature    2. 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 Decreased exercise tolerance  R68.89 ECHOCARDIOGRAM STRESS TEST    Lipid Profile    Lipid Profile    Cardiac Stress Test: Informed Consent Details: Physician/Practitioner Attestation; Transcribe to consent form and obtain patient signature    4. Mixed hyperlipidemia  E78.2 Lipid Profile    Lipid Profile       ASSESSMENT AND PLAN: .    Craig Christian is a 72 y.o. male with hypertension, hyperlipidemia elevated coronary calcium score decreased exercise tolerance  In light of elevated calcium score, and decreased  exercise tolerance, symptoms could possibly be angina equivalent.  Recommend stress echocardiogram.  This should also offer clue to any significant valvular findings, if present.  Continue Repatha.  Repeat fasting lipid panel in 3 months to follow-up on triglycerides, noted to be elevated recently.  Informed Consent   Shared Decision Making/Informed Consent The risks [chest pain, shortness of breath, cardiac arrhythmias, dizziness, blood pressure fluctuations, myocardial infarction, stroke/transient ischemic attack, and life-threatening complications (estimated to be 1 in 10,000)], benefits (risk stratification, diagnosing coronary artery  disease, treatment guidance) and alternatives of a stress or dobutamine stress echocardiogram were discussed in detail with Craig Christian and he agrees to proceed.       F/u in 3 months  Signed, Craig Negus, MD

## 2023-01-01 DIAGNOSIS — E782 Mixed hyperlipidemia: Secondary | ICD-10-CM | POA: Diagnosis not present

## 2023-01-01 DIAGNOSIS — I25118 Atherosclerotic heart disease of native coronary artery with other forms of angina pectoris: Secondary | ICD-10-CM | POA: Diagnosis not present

## 2023-01-01 DIAGNOSIS — Z87898 Personal history of other specified conditions: Secondary | ICD-10-CM | POA: Diagnosis not present

## 2023-01-01 DIAGNOSIS — R6889 Other general symptoms and signs: Secondary | ICD-10-CM | POA: Diagnosis not present

## 2023-01-01 DIAGNOSIS — R931 Abnormal findings on diagnostic imaging of heart and coronary circulation: Secondary | ICD-10-CM | POA: Diagnosis not present

## 2023-01-01 DIAGNOSIS — R972 Elevated prostate specific antigen [PSA]: Secondary | ICD-10-CM | POA: Diagnosis not present

## 2023-01-01 LAB — LIPID PANEL
Chol/HDL Ratio: 3.3 {ratio} (ref 0.0–5.0)
Cholesterol, Total: 178 mg/dL (ref 100–199)
HDL: 54 mg/dL (ref >39)
LDL Chol Calc (NIH): 103 mg/dL — ABNORMAL HIGH (ref 0–99)
Triglycerides: 115 mg/dL (ref 0–149)
VLDL Cholesterol Cal: 21 mg/dL (ref 5–40)

## 2023-01-04 ENCOUNTER — Telehealth: Payer: Self-pay | Admitting: Cardiology

## 2023-01-04 DIAGNOSIS — R972 Elevated prostate specific antigen [PSA]: Secondary | ICD-10-CM | POA: Diagnosis not present

## 2023-01-04 DIAGNOSIS — N401 Enlarged prostate with lower urinary tract symptoms: Secondary | ICD-10-CM | POA: Diagnosis not present

## 2023-01-04 DIAGNOSIS — R350 Frequency of micturition: Secondary | ICD-10-CM | POA: Diagnosis not present

## 2023-01-04 NOTE — Telephone Encounter (Signed)
Pt would like you to add a PSA to labs done Friday.  He said dx would be elevated PSA. Please advise

## 2023-01-04 NOTE — Telephone Encounter (Signed)
Thank you for the follow up.  Please let the patient know.  Thanks MJP

## 2023-01-04 NOTE — Telephone Encounter (Signed)
I am not sure. Can you check with the lab please? Thank you

## 2023-01-04 NOTE — Telephone Encounter (Signed)
Patient would like to know if you can add a PSA to his blood work that was drawn on Friday. He states diagnosis would be elevated PSA

## 2023-01-04 NOTE — Telephone Encounter (Signed)
Spoke with Shaun from [employer] and she states they may be able to add the PSA.  She stated we did not need to place an order in [facility]. Once they pull the blood if they are able to do the test they will fax order to [place] to be signed by provider. If not we will get a fax stating they could not perform test.

## 2023-01-05 NOTE — Telephone Encounter (Signed)
Fax received from [facility] for add on PSA level.  ICD 10 codes written on add on form and signed.  Will fax this back to [facility] accordingly.

## 2023-01-05 NOTE — Telephone Encounter (Signed)
Confirmation fax received back from [facility].

## 2023-01-06 LAB — PSA: Prostate Specific Ag, Serum: 5 ng/mL — ABNORMAL HIGH (ref 0.0–4.0)

## 2023-01-06 LAB — SPECIMEN STATUS REPORT

## 2023-01-07 NOTE — Progress Notes (Signed)
PSA mildly elevated. Please discuss with your PCP/urologist.  Thanks MJP

## 2023-01-12 ENCOUNTER — Telehealth (HOSPITAL_COMMUNITY): Payer: Self-pay | Admitting: *Deleted

## 2023-01-12 NOTE — Telephone Encounter (Signed)
 Gave pt instructions for stress echo.

## 2023-01-14 ENCOUNTER — Telehealth: Payer: Self-pay | Admitting: *Deleted

## 2023-01-14 ENCOUNTER — Ambulatory Visit (HOSPITAL_COMMUNITY): Payer: Medicare HMO | Attending: Cardiology

## 2023-01-14 ENCOUNTER — Ambulatory Visit (HOSPITAL_COMMUNITY): Payer: Medicare HMO

## 2023-01-14 DIAGNOSIS — R931 Abnormal findings on diagnostic imaging of heart and coronary circulation: Secondary | ICD-10-CM | POA: Diagnosis not present

## 2023-01-14 DIAGNOSIS — I25118 Atherosclerotic heart disease of native coronary artery with other forms of angina pectoris: Secondary | ICD-10-CM | POA: Diagnosis not present

## 2023-01-14 DIAGNOSIS — R6889 Other general symptoms and signs: Secondary | ICD-10-CM | POA: Insufficient documentation

## 2023-01-14 MED ORDER — PERFLUTREN LIPID MICROSPHERE
1.0000 mL | INTRAVENOUS | Status: AC | PRN
  Administered 2023-01-14 (×3): 2 mL via INTRAVENOUS

## 2023-01-14 NOTE — Telephone Encounter (Signed)
-----   Message from Yates Decamp sent at 01/14/2023  5:59 PM EST ----- Yes he can stop aspirin.  He is only <20 percentile and coronary calcium score.

## 2023-01-14 NOTE — Telephone Encounter (Signed)
The patient has been notified of the result and verbalized understanding.  All questions (if any) were answered.  Pt aware he can stop taking ASA, per Dr. Jacinto Halim (covering for Dr. Rosemary Holms).  Pt verbalized understanding and agrees with this plan.

## 2023-01-14 NOTE — Progress Notes (Signed)
Low risk exercise stress echo.

## 2023-01-14 NOTE — Progress Notes (Signed)
Yes he can stop aspirin.  He is only <20 percentile and coronary calcium score.

## 2023-03-09 ENCOUNTER — Encounter: Payer: Self-pay | Admitting: Cardiology

## 2023-03-10 NOTE — Telephone Encounter (Signed)
We can make it as needed.

## 2023-03-16 ENCOUNTER — Ambulatory Visit: Payer: Medicare HMO | Admitting: Cardiology

## 2023-05-07 DIAGNOSIS — E782 Mixed hyperlipidemia: Secondary | ICD-10-CM | POA: Diagnosis not present

## 2023-05-07 DIAGNOSIS — N390 Urinary tract infection, site not specified: Secondary | ICD-10-CM | POA: Diagnosis not present

## 2023-05-07 DIAGNOSIS — M810 Age-related osteoporosis without current pathological fracture: Secondary | ICD-10-CM | POA: Diagnosis not present

## 2023-05-07 DIAGNOSIS — I2510 Atherosclerotic heart disease of native coronary artery without angina pectoris: Secondary | ICD-10-CM | POA: Diagnosis not present

## 2023-05-07 DIAGNOSIS — N182 Chronic kidney disease, stage 2 (mild): Secondary | ICD-10-CM | POA: Diagnosis not present

## 2023-05-07 DIAGNOSIS — I10 Essential (primary) hypertension: Secondary | ICD-10-CM | POA: Diagnosis not present

## 2023-05-07 DIAGNOSIS — R972 Elevated prostate specific antigen [PSA]: Secondary | ICD-10-CM | POA: Diagnosis not present

## 2023-08-20 DIAGNOSIS — R5383 Other fatigue: Secondary | ICD-10-CM | POA: Diagnosis not present

## 2023-08-20 DIAGNOSIS — I10 Essential (primary) hypertension: Secondary | ICD-10-CM | POA: Diagnosis not present

## 2023-08-20 DIAGNOSIS — N390 Urinary tract infection, site not specified: Secondary | ICD-10-CM | POA: Diagnosis not present

## 2023-08-20 DIAGNOSIS — N182 Chronic kidney disease, stage 2 (mild): Secondary | ICD-10-CM | POA: Diagnosis not present

## 2023-08-20 DIAGNOSIS — E782 Mixed hyperlipidemia: Secondary | ICD-10-CM | POA: Diagnosis not present

## 2023-08-20 DIAGNOSIS — R972 Elevated prostate specific antigen [PSA]: Secondary | ICD-10-CM | POA: Diagnosis not present

## 2023-08-20 DIAGNOSIS — M810 Age-related osteoporosis without current pathological fracture: Secondary | ICD-10-CM | POA: Diagnosis not present

## 2023-10-13 DIAGNOSIS — N390 Urinary tract infection, site not specified: Secondary | ICD-10-CM | POA: Diagnosis not present

## 2023-10-13 DIAGNOSIS — E782 Mixed hyperlipidemia: Secondary | ICD-10-CM | POA: Diagnosis not present

## 2023-10-13 DIAGNOSIS — M810 Age-related osteoporosis without current pathological fracture: Secondary | ICD-10-CM | POA: Diagnosis not present

## 2023-10-13 DIAGNOSIS — N182 Chronic kidney disease, stage 2 (mild): Secondary | ICD-10-CM | POA: Diagnosis not present

## 2023-10-13 DIAGNOSIS — R5383 Other fatigue: Secondary | ICD-10-CM | POA: Diagnosis not present

## 2023-10-13 DIAGNOSIS — I10 Essential (primary) hypertension: Secondary | ICD-10-CM | POA: Diagnosis not present

## 2023-10-13 DIAGNOSIS — R972 Elevated prostate specific antigen [PSA]: Secondary | ICD-10-CM | POA: Diagnosis not present

## 2023-10-22 DIAGNOSIS — Z0000 Encounter for general adult medical examination without abnormal findings: Secondary | ICD-10-CM | POA: Diagnosis not present

## 2023-10-22 DIAGNOSIS — M810 Age-related osteoporosis without current pathological fracture: Secondary | ICD-10-CM | POA: Diagnosis not present

## 2023-10-22 DIAGNOSIS — R972 Elevated prostate specific antigen [PSA]: Secondary | ICD-10-CM | POA: Diagnosis not present

## 2023-10-22 DIAGNOSIS — N182 Chronic kidney disease, stage 2 (mild): Secondary | ICD-10-CM | POA: Diagnosis not present

## 2023-10-22 DIAGNOSIS — R5383 Other fatigue: Secondary | ICD-10-CM | POA: Diagnosis not present

## 2023-10-22 DIAGNOSIS — E782 Mixed hyperlipidemia: Secondary | ICD-10-CM | POA: Diagnosis not present

## 2023-10-22 DIAGNOSIS — N390 Urinary tract infection, site not specified: Secondary | ICD-10-CM | POA: Diagnosis not present

## 2023-10-22 DIAGNOSIS — I10 Essential (primary) hypertension: Secondary | ICD-10-CM | POA: Diagnosis not present
